# Patient Record
Sex: Male | Born: 1985 | Race: Black or African American | Hispanic: No | Marital: Single | Smoking: Never smoker
Health system: Southern US, Community
[De-identification: ages and names within clinical notes are randomized; demographics above are authoritative.]

## PROBLEM LIST (undated history)

## (undated) DIAGNOSIS — I10 Essential (primary) hypertension: Secondary | ICD-10-CM

---

## 2020-01-20 ENCOUNTER — Inpatient Hospital Stay (HOSPITAL_COMMUNITY): Payer: Medicaid Other

## 2020-01-20 ENCOUNTER — Other Ambulatory Visit: Payer: Self-pay

## 2020-01-20 ENCOUNTER — Inpatient Hospital Stay (HOSPITAL_COMMUNITY)
Admission: EM | Admit: 2020-01-20 | Discharge: 2020-01-24 | DRG: 280 | Disposition: A | Discharge status: Home / Self Care | Payer: Medicaid Other | Attending: Internal Medicine | Admitting: Internal Medicine

## 2020-01-20 ENCOUNTER — Emergency Department (HOSPITAL_COMMUNITY): Payer: Medicaid Other

## 2020-01-20 ENCOUNTER — Encounter (HOSPITAL_COMMUNITY): Payer: Self-pay | Admitting: Emergency Medicine

## 2020-01-20 DIAGNOSIS — Z91120 Patient's intentional underdosing of medication regimen due to financial hardship: Secondary | ICD-10-CM | POA: Diagnosis not present

## 2020-01-20 DIAGNOSIS — I160 Hypertensive urgency: Secondary | ICD-10-CM | POA: Diagnosis not present

## 2020-01-20 DIAGNOSIS — I10 Essential (primary) hypertension: Secondary | ICD-10-CM

## 2020-01-20 DIAGNOSIS — I272 Pulmonary hypertension, unspecified: Secondary | ICD-10-CM | POA: Diagnosis present

## 2020-01-20 DIAGNOSIS — D649 Anemia, unspecified: Secondary | ICD-10-CM | POA: Diagnosis present

## 2020-01-20 DIAGNOSIS — T465X6A Underdosing of other antihypertensive drugs, initial encounter: Secondary | ICD-10-CM | POA: Diagnosis present

## 2020-01-20 DIAGNOSIS — I5043 Acute on chronic combined systolic (congestive) and diastolic (congestive) heart failure: Secondary | ICD-10-CM | POA: Diagnosis present

## 2020-01-20 DIAGNOSIS — Z23 Encounter for immunization: Secondary | ICD-10-CM | POA: Diagnosis not present

## 2020-01-20 DIAGNOSIS — E876 Hypokalemia: Secondary | ICD-10-CM | POA: Diagnosis present

## 2020-01-20 DIAGNOSIS — N179 Acute kidney failure, unspecified: Secondary | ICD-10-CM | POA: Diagnosis present

## 2020-01-20 DIAGNOSIS — Z20822 Contact with and (suspected) exposure to covid-19: Secondary | ICD-10-CM | POA: Diagnosis present

## 2020-01-20 DIAGNOSIS — E785 Hyperlipidemia, unspecified: Secondary | ICD-10-CM | POA: Diagnosis present

## 2020-01-20 DIAGNOSIS — Z9114 Patient's other noncompliance with medication regimen: Secondary | ICD-10-CM

## 2020-01-20 DIAGNOSIS — F1290 Cannabis use, unspecified, uncomplicated: Secondary | ICD-10-CM | POA: Diagnosis present

## 2020-01-20 DIAGNOSIS — I5020 Unspecified systolic (congestive) heart failure: Secondary | ICD-10-CM | POA: Diagnosis not present

## 2020-01-20 DIAGNOSIS — R079 Chest pain, unspecified: Secondary | ICD-10-CM | POA: Diagnosis present

## 2020-01-20 DIAGNOSIS — I161 Hypertensive emergency: Secondary | ICD-10-CM | POA: Diagnosis present

## 2020-01-20 DIAGNOSIS — I428 Other cardiomyopathies: Secondary | ICD-10-CM | POA: Diagnosis present

## 2020-01-20 DIAGNOSIS — I214 Non-ST elevation (NSTEMI) myocardial infarction: Principal | ICD-10-CM | POA: Diagnosis present

## 2020-01-20 DIAGNOSIS — Z8249 Family history of ischemic heart disease and other diseases of the circulatory system: Secondary | ICD-10-CM | POA: Diagnosis not present

## 2020-01-20 DIAGNOSIS — I110 Hypertensive heart disease with heart failure: Secondary | ICD-10-CM | POA: Diagnosis present

## 2020-01-20 DIAGNOSIS — R778 Other specified abnormalities of plasma proteins: Secondary | ICD-10-CM | POA: Diagnosis not present

## 2020-01-20 DIAGNOSIS — Z7289 Other problems related to lifestyle: Secondary | ICD-10-CM

## 2020-01-20 DIAGNOSIS — K769 Liver disease, unspecified: Secondary | ICD-10-CM | POA: Diagnosis present

## 2020-01-20 DIAGNOSIS — I2510 Atherosclerotic heart disease of native coronary artery without angina pectoris: Secondary | ICD-10-CM | POA: Diagnosis present

## 2020-01-20 HISTORY — DX: Essential (primary) hypertension: I10

## 2020-01-20 LAB — CBC WITH DIFFERENTIAL/PLATELET
Abs Immature Granulocytes: 0.01 K/uL (ref 0.00–0.07)
Basophils Absolute: 0 K/uL (ref 0.0–0.1)
Basophils Relative: 1 %
Eosinophils Absolute: 0.1 K/uL (ref 0.0–0.5)
Eosinophils Relative: 1 %
HCT: 38.2 % — ABNORMAL LOW (ref 39.0–52.0)
Hemoglobin: 12.8 g/dL — ABNORMAL LOW (ref 13.0–17.0)
Immature Granulocytes: 0 %
Lymphocytes Relative: 24 %
Lymphs Abs: 1.6 K/uL (ref 0.7–4.0)
MCH: 29.4 pg (ref 26.0–34.0)
MCHC: 33.5 g/dL (ref 30.0–36.0)
MCV: 87.6 fL (ref 80.0–100.0)
Monocytes Absolute: 0.3 K/uL (ref 0.1–1.0)
Monocytes Relative: 5 %
Neutro Abs: 4.6 K/uL (ref 1.7–7.7)
Neutrophils Relative %: 69 %
Platelets: 211 K/uL (ref 150–400)
RBC: 4.36 MIL/uL (ref 4.22–5.81)
RDW: 12.6 % (ref 11.5–15.5)
WBC: 6.6 K/uL (ref 4.0–10.5)
nRBC: 0 % (ref 0.0–0.2)

## 2020-01-20 LAB — HEPARIN LEVEL (UNFRACTIONATED): Heparin Unfractionated: 0.38 IU/mL (ref 0.30–0.70)

## 2020-01-20 LAB — COMPREHENSIVE METABOLIC PANEL WITH GFR
ALT: 21 U/L (ref 0–44)
AST: 24 U/L (ref 15–41)
Albumin: 4.2 g/dL (ref 3.5–5.0)
Alkaline Phosphatase: 52 U/L (ref 38–126)
Anion gap: 10 (ref 5–15)
BUN: 21 mg/dL — ABNORMAL HIGH (ref 6–20)
CO2: 23 mmol/L (ref 22–32)
Calcium: 9 mg/dL (ref 8.9–10.3)
Chloride: 103 mmol/L (ref 98–111)
Creatinine, Ser: 1.44 mg/dL — ABNORMAL HIGH (ref 0.61–1.24)
GFR calc Af Amer: >60 mL/min
GFR calc non Af Amer: >60 mL/min
Glucose, Bld: 128 mg/dL — ABNORMAL HIGH (ref 70–99)
Potassium: 3.8 mmol/L (ref 3.5–5.1)
Sodium: 136 mmol/L (ref 135–145)
Total Bilirubin: 0.8 mg/dL (ref 0.3–1.2)
Total Protein: 7.4 g/dL (ref 6.5–8.1)

## 2020-01-20 LAB — HIV ANTIBODY (ROUTINE TESTING W REFLEX): HIV Screen 4th Generation wRfx: NONREACTIVE

## 2020-01-20 LAB — TSH: TSH: 0.571 u[IU]/mL (ref 0.350–4.500)

## 2020-01-20 LAB — PROTIME-INR
INR: 0.9 (ref 0.8–1.2)
Prothrombin Time: 12.1 seconds (ref 11.4–15.2)

## 2020-01-20 LAB — MRSA PCR SCREENING: MRSA by PCR: NEGATIVE

## 2020-01-20 LAB — TROPONIN I (HIGH SENSITIVITY)
Troponin I (High Sensitivity): 145 ng/L
Troponin I (High Sensitivity): 260 ng/L (ref <18)
Troponin I (High Sensitivity): 347 ng/L (ref <18)
Troponin I (High Sensitivity): 535 ng/L (ref <18)

## 2020-01-20 LAB — ECHOCARDIOGRAM COMPLETE
Height: 72 in
Weight: 3184 oz

## 2020-01-20 LAB — APTT: aPTT: 20 seconds — ABNORMAL LOW (ref 24–36)

## 2020-01-20 LAB — SARS CORONAVIRUS 2 (TAT 6-24 HRS): SARS Coronavirus 2: NEGATIVE

## 2020-01-20 LAB — BRAIN NATRIURETIC PEPTIDE: B Natriuretic Peptide: 216.7 pg/mL — ABNORMAL HIGH (ref 0.0–100.0)

## 2020-01-20 LAB — T4, FREE: Free T4: 1.07 ng/dL (ref 0.61–1.12)

## 2020-01-20 MED ORDER — FUROSEMIDE 10 MG/ML IJ SOLN
20.0000 mg | Freq: Once | INTRAMUSCULAR | Status: AC
  Administered 2020-01-20: 20 mg via INTRAVENOUS
  Filled 2020-01-20: qty 4

## 2020-01-20 MED ORDER — HEPARIN (PORCINE) 25000 UT/250ML-% IV SOLN
1150.0000 [IU]/h | INTRAVENOUS | Status: DC
  Administered 2020-01-20 – 2020-01-21 (×2): 1050 [IU]/h via INTRAVENOUS
  Administered 2020-01-23: 1150 [IU]/h via INTRAVENOUS
  Filled 2020-01-20 (×4): qty 250

## 2020-01-20 MED ORDER — ASPIRIN 81 MG PO CHEW
324.0000 mg | CHEWABLE_TABLET | Freq: Once | ORAL | Status: AC
  Administered 2020-01-20: 324 mg via ORAL
  Filled 2020-01-20: qty 4

## 2020-01-20 MED ORDER — ENOXAPARIN SODIUM 40 MG/0.4ML ~~LOC~~ SOLN: 40.0000 mg | SUBCUTANEOUS | Status: DC

## 2020-01-20 MED ORDER — ONDANSETRON HCL 4 MG/2ML IJ SOLN
4.0000 mg | Freq: Four times a day (QID) | INTRAMUSCULAR | Status: DC | PRN
  Administered 2020-01-20: 4 mg via INTRAVENOUS
  Filled 2020-01-20: qty 2

## 2020-01-20 MED ORDER — ATORVASTATIN CALCIUM 40 MG PO TABS
40.0000 mg | ORAL_TABLET | Freq: Every day | ORAL | Status: DC
  Filled 2020-01-20: qty 1

## 2020-01-20 MED ORDER — LABETALOL HCL 5 MG/ML IV SOLN
0.5000 mg/min | INTRAVENOUS | Status: DC
  Administered 2020-01-20: 0.5 mg/min via INTRAVENOUS
  Administered 2020-01-20 (×2): 2 mg/min via INTRAVENOUS
  Administered 2020-01-21: 0.5 mg/min via INTRAVENOUS
  Filled 2020-01-20 (×6): qty 100
  Filled 2020-01-20: qty 80

## 2020-01-20 MED ORDER — ASPIRIN EC 81 MG PO TBEC
81.0000 mg | DELAYED_RELEASE_TABLET | Freq: Every day | ORAL | Status: DC
  Administered 2020-01-21 – 2020-01-24 (×4): 81 mg via ORAL
  Filled 2020-01-20 (×4): qty 1

## 2020-01-20 MED ORDER — CHLORHEXIDINE GLUCONATE CLOTH 2 % EX PADS
6.0000 | MEDICATED_PAD | Freq: Every day | CUTANEOUS | Status: DC
  Administered 2020-01-21 – 2020-01-22 (×2): 6 via TOPICAL

## 2020-01-20 MED ORDER — FAMOTIDINE 20 MG PO TABS
20.0000 mg | ORAL_TABLET | Freq: Every day | ORAL | Status: DC
  Administered 2020-01-20: 20 mg via ORAL
  Filled 2020-01-20: qty 1

## 2020-01-20 MED ORDER — NITROGLYCERIN 0.4 MG SL SUBL
0.4000 mg | SUBLINGUAL_TABLET | SUBLINGUAL | Status: AC | PRN
  Administered 2020-01-20 (×3): 0.4 mg via SUBLINGUAL
  Filled 2020-01-20: qty 1

## 2020-01-20 MED ORDER — ONDANSETRON HCL 4 MG PO TABS: 4.0000 mg | ORAL_TABLET | Freq: Four times a day (QID) | ORAL | Status: DC | PRN

## 2020-01-20 MED ORDER — HEPARIN BOLUS VIA INFUSION
4000.0000 [IU] | Freq: Once | INTRAVENOUS | Status: AC
  Administered 2020-01-20: 4000 [IU] via INTRAVENOUS
  Filled 2020-01-20: qty 4000

## 2020-01-20 NOTE — H&P (Signed)
NAME:  Jose Sharp, MRN:  [id number], DOB:  18-Sep-1986, LOS: 0 ADMISSION DATE:  01/20/2020, CONSULTATION DATE:  01/20/2020  REFERRING MD:  DR Agbata of [facility], CHIEF COMPLAINT:  Hypertensive emergency   Brief History   x  History of present illness   History is again taken to the patient and Dr. Joylene Igo of tried hospitalist service  34 year old male with a history significant for hypertension.  Last night he ate a subsandwich at 9:30 PM and went to bed.  He woke up early hours of this morning January 20, 2020 due to chest pain and indigestion.  There was associated shortness of breath.  Symptoms got worse with lying down.  There is no headache or dizziness or lightheadedness abdominal pain or urinary symptoms or stroke symptoms.  In the emergency department his systolic blood pressure was 251/179.  Troponin was slightly elevated.  Labetalol infusion was started and the systolic blood pressure was ranging anywhere from 140-160.  His creatinine was 1.44 mg percent.  [No priors] patient was evaluated by cardiology and they noticed that with labetalol his symptoms had improved.  Initial cardiac evaluation showed left arm and right arm systolic blood pressures to be equal but at the time of CCM evaluation on the right arm it was 145 systolic with a MAP of 125 but is in the left arm it was 165 systolic with a slightly more elevated map.  Patient is at [facility] long emergency department but needs to be admitted to cardiac ICU at [facility].  CCM MD admitting and taking over from [facility] hospitalist  Regarding risk factors patient drinks alcohol 4 days/week in varying forms.  Uses marijuana but denies other illicit drugs.  There is concern for sleep apnea because he snores according to cardiology notes.  But has not been tested for sleep apnea. Nom compliant with bp meds  ECHo today - ef 25% with gr3 diast dsyfn  Results for MARSTON, MCCADDEN (MRN [id number]) as of 01/20/2020 17:07  Ref. Range  01/20/2020 07:54 01/20/2020 10:11 01/20/2020 11:29 01/20/2020 12:58  Troponin I (High Sensitivity) Latest Ref Range: <18 ng/L 145 (HH) 260 (HH) 347 (HH) 535 (HH)    Past Medical History     has a past medical history of Hypertension.   reports that he has never smoked. He has never used smokeless tobacco.  History reviewed. No pertinent surgical history.  No Known Allergies   There is no immunization history on file for this patient.  Family History  Problem Relation Age of Onset  . Hypertension Mother      Current Facility-Administered Medications:  .  [START ON 01/21/2020] aspirin EC tablet 81 mg, 81 mg, Oral, Daily, Agbata, Tochukwu, MD .  atorvastatin (LIPITOR) tablet 40 mg, 40 mg, Oral, q1800, Agbata, Tochukwu, MD .  heparin ADULT infusion 100 units/mL (25000 units/267mL sodium chloride 0.45%), 1,050 Units/hr, Intravenous, Continuous, Maurice March, RPH, Last Rate: 10.5 mL/hr at 01/20/20 1206, 1,050 Units/hr at 01/20/20 1206 .  labetalol (NORMODYNE) 500 mg in dextrose 5 % 125 mL (4 mg/mL) infusion, 0.5-3 mg/min, Intravenous, Titrated, Pricilla Loveless, MD, Last Rate: 37.5 mL/hr at 01/20/20 1415, 2.5 mg/min at 01/20/20 1415 .  ondansetron (ZOFRAN) tablet 4 mg, 4 mg, Oral, Q6H PRN **OR** ondansetron (ZOFRAN) injection 4 mg, 4 mg, Intravenous, Q6H PRN, Agbata, Tochukwu, MD No current outpatient medications on file.   Significant Hospital Events    01/20/2020  - admit  Consults:  01/20/2020 - cards  Procedures:  01/20/2020 - ct  chest ordered  Significant Diagnostic Tests:  x  Micro Data:  x  Antimicrobials:  x   Interim history/subjective:  01/20/2020 0 seen by CCM [place] ER bed 05  Objective   Blood pressure (!) 146/103, pulse 68, temperature 98.1 F (36.7 C), temperature source Oral, resp. rate (!) 22, height 6' (1.829 m), weight 90.3 kg, SpO2 95 %.       No intake or output data in the 24 hours ending 01/20/20 1659 Filed Weights   01/20/20 0744  Weight:  90.3 kg    Examination: General: Pleasant male lying down in the stretcher in the emergency department HENT: No elevated JVP.  No neck nodes. Lungs: Clear to auscultation bilaterally.  No crackles Cardiovascular: Regular rate and rhythm.  Hypertensive. Abdomen: Soft nontender no organomegaly Extremities: No cyanosis no clubbing no edema.  Pulses equal on both sides Neuro: Alert and oriented x3.  No focal deficits GU: Not examined  Resolved Hospital Problem list   X  Assessment & Plan:  Hypertensive emergency likely non compliance with elevated troponin Chronic systolic cHF with diast dysfn  Plan  - labetalol infusion - 25% reduction - goal 170-180 - heparin gtt per cards  - check UDS  - CT chest without contrast rule out dissection  - cHF mgmt per cards - will give lasix x 1 x 20mg  though  Mild anemia - unknown baseline  Plan  - check Iron, B12 - - PRBC for hgb </= 6.9gm%    - exceptions are   -  if ACS susepcted/confirmed then transfuse for hgb </= 8.0gm%,  or    -  active bleeding with hemodynamic instability, then transfuse regardless of hemoglobin value   At at all times try to transfuse 1 unit prbc as possible with exception of active hemorrhage   AKI v CKD - creat 1.44- unclear baseline  Plan - monitor with bp control  And lasix x 1    Best practice:  Diet: npo but for meds Pain/Anxiety/Delirium protocol (if indicated): x VAP protocol (if indicated): x DVT prophylaxis: heparin gtt GI prophylaxis: pepcid Glucose control: x Mobility: bed rest Code Status: full Family Communication: patient Disposition: From [facility] to [facility]     ATTESTATION & SIGNATURE    Dr. , M.D., F.C.C.P Pulmonary and Critical Care Medicine Staff Physician [facility] Pager: [phone], If no answer or between  15:00h - 7:00h: call [phone]  01/20/2020 4:59 PM     LABS    PULMONARY No results for  input(s): PHART, PCO2ART, PO2ART, HCO3, TCO2, O2SAT in the last 168 hours.  Invalid input(s): PCO2, PO2  CBC Recent Labs  Lab 01/20/20 0754  HGB 12.8*  HCT 38.2*  WBC 6.6  PLT 211    COAGULATION Recent Labs  Lab 01/20/20 1129  INR 0.9    CARDIAC  No results for input(s): TROPONINI in the last 168 hours. No results for input(s): PROBNP in the last 168 hours.   CHEMISTRY Recent Labs  Lab 01/20/20 0754  NA 136  K 3.8  CL 103  CO2 23  GLUCOSE 128*  BUN 21*  CREATININE 1.44*  CALCIUM 9.0   Estimated Creatinine Clearance: 78.6 mL/min (A) (by C-G formula based on SCr of 1.44 mg/dL (H)).   LIVER Recent Labs  Lab 01/20/20 0754 01/20/20 1129  AST 24  --   ALT 21  --   ALKPHOS 52  --   BILITOT  0.8  --   PROT 7.4  --   ALBUMIN 4.2  --   INR  --  0.9     INFECTIOUS No results for input(s): LATICACIDVEN, PROCALCITON in the last 168 hours.   ENDOCRINE CBG (last 3)  No results for input(s): GLUCAP in the last 72 hours.       IMAGING x48h  - image(s) personally visualized  -   highlighted in bold DG Chest 2 View  Result Date: 01/20/2020 CLINICAL DATA:  Chest pain and hypertension EXAM: CHEST - 2 VIEW COMPARISON:  None. FINDINGS: Borderline enlargement of the left ventricle. Interstitial opacity with [employer]. No effusion or pneumothorax. IMPRESSION: Pulmonary edema. Electronically Signed   By: Monte Fantasia M.D.   On: 01/20/2020 08:56   ECHOCARDIOGRAM COMPLETE  Result Date: 01/20/2020    ECHOCARDIOGRAM REPORT   Patient Name:   MARSELINO Ishman Date of Exam: 01/20/2020 Medical Rec #:  [id number]         Height:       72.0 in Accession #:    [id number]        Weight:       199.0 lb Date of Birth:  [birth year]        BSA:          2.126 m Patient Age:    41 years          BP:           162/121 mmHg Patient Gender: M                 HR:           73 bpm. Exam Location:  Inpatient Procedure: 2D Echo, Cardiac Doppler and Color Doppler Indications:    Chest  pain  History:        Patient has no prior history of Echocardiogram examinations.                 Arrythmias:Abnormal EKG; Signs/Symptoms:Chest Pain.  Sonographer:    Dustin Flock Referring Phys: [id number] TOCHUKWU AGBATA IMPRESSIONS  1. Left ventricular ejection fraction, by estimation, is 25 to 30%. The left ventricle has severely decreased function. The left ventricle demonstrates global hypokinesis. There is moderate concentric left ventricular hypertrophy. Left ventricular diastolic parameters are consistent with Grade III diastolic dysfunction (restrictive). Elevated left atrial pressure.  2. Right ventricular systolic function is low normal. The right ventricular size is normal. There is mildly elevated pulmonary artery systolic pressure. The estimated right ventricular systolic pressure is 03.5 mmHg.  3. Left atrial size was mildly dilated.  4. The mitral valve is grossly normal. Trivial mitral valve regurgitation. No evidence of mitral stenosis.  5. The aortic valve is tricuspid. Aortic valve regurgitation is not visualized. No aortic stenosis is present.  6. The inferior vena cava is normal in size with <50% respiratory variability, suggesting right atrial pressure of 8 mmHg. FINDINGS  Left Ventricle: Left ventricular ejection fraction, by estimation, is 25 to 30%. The left ventricle has severely decreased function. The left ventricle demonstrates global hypokinesis. The left ventricular internal cavity size was normal in size. There is moderate concentric left ventricular hypertrophy. Left ventricular diastolic parameters are consistent with Grade III diastolic dysfunction (restrictive). Elevated left atrial pressure. Right Ventricle: The right ventricular size is normal. No increase in right ventricular wall thickness. Right ventricular systolic function is low normal. There is mildly elevated pulmonary artery systolic pressure. The tricuspid regurgitant velocity is 2.73 m/s, and with an assumed right  atrial pressure of 8 mmHg, the estimated right ventricular systolic pressure is 37.8 mmHg. Left Atrium: Left atrial size was mildly dilated. Right Atrium: Right atrial size was normal in size. Pericardium: There is no evidence of pericardial effusion. Mitral Valve: The mitral valve is grossly normal. Trivial mitral valve regurgitation. No evidence of mitral valve stenosis. Tricuspid Valve: The tricuspid valve is grossly normal. Tricuspid valve regurgitation is not demonstrated. No evidence of tricuspid stenosis. Aortic Valve: The aortic valve is tricuspid. Aortic valve regurgitation is not visualized. No aortic stenosis is present. Pulmonic Valve: The pulmonic valve was grossly normal. Pulmonic valve regurgitation is not visualized. No evidence of pulmonic stenosis. Aorta: The aortic root is normal in size and structure. Venous: The inferior vena cava is normal in size with less than 50% respiratory variability, suggesting right atrial pressure of 8 mmHg. IAS/Shunts: No atrial level shunt detected by color flow Doppler.  LEFT VENTRICLE PLAX 2D LVIDd:         5.40 cm  Diastology LVIDs:         4.50 cm  LV e' lateral:   6.00 cm/s LV PW:         1.50 cm  LV E/e' lateral: 13.6 LV IVS:        1.50 cm  LV e' medial:    5.98 cm/s LVOT diam:     2.60 cm  LV E/e' medial:  13.6 LV SV:         73 LV SV Index:   34 LVOT Area:     5.31 cm  RIGHT VENTRICLE RV Basal diam:  2.60 cm RV S prime:     9.57 cm/s TAPSE (M-mode): 2.5 cm LEFT ATRIUM             Index       RIGHT ATRIUM           Index LA diam:        4.60 cm 2.16 cm/m  RA Area:     14.20 cm LA Vol (A2C):   67.0 ml 31.51 ml/m RA Volume:   35.00 ml  16.46 ml/m LA Vol (A4C):   68.6 ml 32.27 ml/m LA Biplane Vol: 68.7 ml 32.31 ml/m  AORTIC VALVE LVOT Vmax:   70.50 cm/s LVOT Vmean:  51.300 cm/s LVOT VTI:    0.137 m  AORTA Ao Root diam: 3.20 cm MITRAL VALVE               TRICUSPID VALVE MV Area (PHT): 4.49 cm    TR Peak grad:   29.8 mmHg MV Decel Time: 169 msec    TR  Vmax:        273.00 cm/s MV E velocity: 81.40 cm/s MV A velocity: 38.10 cm/s  SHUNTS MV E/A ratio:  2.14        Systemic VTI:  0.14 m                            Systemic Diam: 2.60 cm Lennie Odor MD Electronically signed by Lennie Odor MD Signature Date/Time: 01/20/2020/3:13:38 PM    Final

## 2020-01-20 NOTE — ED Notes (Signed)
[facility] called for transport. 

## 2020-01-20 NOTE — ED Notes (Signed)
Pt aware urine sample is needed 

## 2020-01-20 NOTE — ED Triage Notes (Signed)
Patient arrived by self from phone. Patient c/o Chest pain on sternal area that started 1000 PM last night.   History of HTN.

## 2020-01-20 NOTE — ED Notes (Signed)
[facility] bedside.  

## 2020-01-20 NOTE — Progress Notes (Signed)
ANTICOAGULATION CONSULT NOTE   Pharmacy Consult for heparin Indication: chest pain/ACS  No Known Allergies  Patient Measurements: Height: 6' (182.9 cm) Weight: 199 lb (90.3 kg) IBW/kg (Calculated) : 77.6 Heparin Dosing Weight: 90.3kg  Vital Signs: Temp: 98.3 F (36.8 C) (03/26 2000) Temp Source: Oral (03/26 2000) BP: 148/108 (03/26 1945) Pulse Rate: 71 (03/26 1945)  Labs: Recent Labs    01/20/20 0754 01/20/20 0754 01/20/20 1011 01/20/20 1129 01/20/20 1258 01/20/20 2012  HGB 12.8*  --   --   --   --   --   HCT 38.2*  --   --   --   --   --   PLT 211  --   --   --   --   --   APTT  --   --   --  20*  --   --   LABPROT  --   --   --  12.1  --   --   INR  --   --   --  0.9  --   --   HEPARINUNFRC  --   --   --   --   --  0.38  CREATININE 1.44*  --   --   --   --   --   TROPONINIHS 145*   < > 260* 347* 535*  --    < > = values in this interval not displayed.    Estimated Creatinine Clearance: 78.6 mL/min (A) (by C-G formula based on SCr of 1.44 mg/dL (H)).   Medical History: Past Medical History:  Diagnosis Date  . Hypertension      Assessment: 34 y.o. male with medical history significant for hypertension who presents to the ER for evaluation of chest pain.  Pharmacy consulted to dose heparin.  No prior AC noted. Echo ordered -heparin level at goal  Goal of Therapy:  Heparin level 0.3-0.7 units/ml Monitor platelets by anticoagulation protocol   Plan:  -Continue heparin at 1050 units/hr -Daily heparin level and CBC  Harland German, PharmD Clinical Pharmacist **Pharmacist phone directory can now be found on [email or web address] ([email or web address]).  Listed under [facility].

## 2020-01-20 NOTE — ED Provider Notes (Signed)
[facility] DEPT Provider Note   CSN: [id number] Arrival date & time: 01/20/20  5400     History Chief Complaint  Patient presents with  . Chest Pain    Jose Sharp is a 34 y.o. male.  HPI 34 year old male presents with chest pain.  Started last night shortly after eating half of a [employer] sub.  Feels like a pressure in the middle and just right of middle in his chest.  No radiation of the pain.  No shortness of breath, back pain, abdominal pain or weakness.  No leg swelling.  Felt a little better after drinking water but then when he laid down last night the pain seemed to get worse.  Has been on and off all night.  Right now is moderate, 5 out of 10.  He has a history of hypertension but is not on treatment at this time.  Walking around/exertion did not make the pain worse.   Past Medical History:  Diagnosis Date  . Hypertension     There are no problems to display for this patient.   History reviewed. No pertinent surgical history.     No family history on file.  Social History   Tobacco Use  . Smoking status: Not on file  Substance Use Topics  . Alcohol use: Not on file  . Drug use: Not on file    Home Medications Prior to Admission medications   Not on File    Allergies    Patient has no known allergies.  Review of Systems   Review of Systems  Constitutional: Negative for diaphoresis.  Respiratory: Negative for shortness of breath.   Cardiovascular: Positive for chest pain. Negative for leg swelling.  Gastrointestinal: Negative for abdominal pain, nausea and vomiting.  Musculoskeletal: Negative for back pain.  All other systems reviewed and are negative.   Physical Exam Updated Vital Signs BP (!) 211/148   Pulse 79   Temp 98.1 F (36.7 C) (Oral)   Resp (!) 21   Ht 6' (1.829 m)   Wt 90.3 kg   SpO2 99%   BMI 26.99 kg/m   Physical Exam Vitals and nursing note reviewed.  Constitutional:      General: He  is not in acute distress.    Appearance: He is well-developed. He is not ill-appearing or diaphoretic.  HENT:     Head: Normocephalic and atraumatic.     Right Ear: External ear normal.     Left Ear: External ear normal.     Nose: Nose normal.  Eyes:     General:        Right eye: No discharge.        Left eye: No discharge.  Cardiovascular:     Rate and Rhythm: Normal rate and regular rhythm.     Pulses:          Radial pulses are 2+ on the right side and 2+ on the left side.     Heart sounds: Normal heart sounds.  Pulmonary:     Effort: Pulmonary effort is normal.     Breath sounds: Normal breath sounds.  Abdominal:     Palpations: Abdomen is soft.     Tenderness: There is no abdominal tenderness.  Musculoskeletal:     Cervical back: Neck supple.     Right lower leg: No edema.     Left lower leg: No edema.  Skin:    General: Skin is warm and dry.  Neurological:  Mental Status: He is alert.  Psychiatric:        Mood and Affect: Mood is not anxious.     ED Results / Procedures / Treatments   Labs (all labs ordered are listed, but only abnormal results are displayed) Labs Reviewed  CBC WITH DIFFERENTIAL/PLATELET - Abnormal; Notable for the following components:      Result Value   Hemoglobin 12.8 (*)    HCT 38.2 (*)    All other components within normal limits  COMPREHENSIVE METABOLIC PANEL - Abnormal; Notable for the following components:   Glucose, Bld 128 (*)    BUN 21 (*)    Creatinine, Ser 1.44 (*)    All other components within normal limits  BRAIN NATRIURETIC PEPTIDE - Abnormal; Notable for the following components:   B Natriuretic Peptide 216.7 (*)    All other components within normal limits  TROPONIN I (HIGH SENSITIVITY) - Abnormal; Notable for the following components:   Troponin I (High Sensitivity) 145 (*)    All other components within normal limits  SARS CORONAVIRUS 2 (TAT 6-24 HRS)  TROPONIN I (HIGH SENSITIVITY)    EKG EKG  Interpretation  Date/Time:  Friday January 20 2020 07:45:55 EDT Ventricular Rate:  104 PR Interval:    QRS Duration: 101 QT Interval:  334 QTC Calculation: 440 R Axis:   68 Text Interpretation: Sinus tachycardia Probable left atrial enlargement LVH with secondary repolarization abnormality ST depr, consider ischemia, inferior leads Baseline wander in lead(s) II III aVF No old tracing to compare Confirmed by Pricilla Loveless [phone]) on 01/20/2020 7:54:32 AM   Radiology DG Chest 2 View  Result Date: 01/20/2020 CLINICAL DATA:  Chest pain and hypertension EXAM: CHEST - 2 VIEW COMPARISON:  None. FINDINGS: Borderline enlargement of the left ventricle. Interstitial opacity with [employer]. No effusion or pneumothorax. IMPRESSION: Pulmonary edema. Electronically Signed   By: Marnee Spring M.D.   On: 01/20/2020 08:56    Procedures .Critical Care Performed by: Pricilla Loveless, MD Authorized by: Pricilla Loveless, MD   Critical care provider statement:    Critical care time (minutes):  35   Critical care time was exclusive of:  Separately billable procedures and treating other patients   Critical care was necessary to treat or prevent imminent or life-threatening deterioration of the following conditions:  Cardiac failure   Critical care was time spent personally by me on the following activities:  Discussions with consultants, evaluation of patient's response to treatment, examination of patient, ordering and performing treatments and interventions, ordering and review of laboratory studies, ordering and review of radiographic studies, pulse oximetry, re-evaluation of patient's condition, obtaining history from patient or surrogate and review of old charts   (including critical care time)  Medications Ordered in ED Medications  labetalol (NORMODYNE) 500 mg in dextrose 5 % 125 mL (4 mg/mL) infusion (has no administration in time range)  aspirin chewable tablet 324 mg (324 mg Oral Given 01/20/20  0801)  nitroGLYCERIN (NITROSTAT) SL tablet 0.4 mg (0.4 mg Sublingual Given 01/20/20 6213)    ED Course  I have reviewed the triage vital signs and the nursing notes.  Pertinent labs & imaging results that were available during my care of the patient were reviewed by me and considered in my medical decision making (see chart for details).  Clinical Course as of Jan 20 1023  Fri Jan 20, 2020  1006 Discussed with Dr. Cristal Deer of cardiology.  Recommends blood pressure control as if this is hypertensive emergency.  Otherwise,  this appears to be related to the hypertension primarily and unlikely to be ACS.  Recommends hospitalist admission, echo, blood pressure control.  No indication he needs to come to [facility].   [SG]  1016 Discussed with Dr. Joylene Igo for admission. Pain is essentially gone (occasionally a 1). She will see and consider what other anti-hypertensives, otherwise hold off on more meds for now. Requests cards consult, Rosann Auerbach updated on need for consultation.   [SG]  1022 Dr Joylene Igo asks for labetalol infusion given continued high blood pressure   [SG]    Clinical Course User Index [SG] Pricilla Loveless, MD   MDM Rules/Calculators/A&P                      Patient's chest pain is atypical.  However with his significantly elevated blood pressure, work-up obtained and shows elevated troponin.  ECG is abnormal but probably LVH.  No STEMI.  He is not having back pain, tearing type pain, or any other concerning features for aortic dissection.  However I do think that the blood pressure is directly causing the troponin elevation and so this would technically be hypertensive emergency.  Given meds as above. Given aspirin. No heparin at this time.  Ibrohim Simmers was evaluated in Emergency Department on 01/20/2020 for the symptoms described in the history of present illness. He was evaluated in the context of the global COVID-19 pandemic, which necessitated consideration that the patient  might be at risk for infection with the SARS-CoV-2 virus that causes COVID-19. Institutional protocols and algorithms that pertain to the evaluation of patients at risk for COVID-19 are in a state of rapid change based on information released by regulatory bodies including the CDC and federal and state organizations. These policies and algorithms were followed during the patient's care in the ED.  Final Clinical Impression(s) / ED Diagnoses Final diagnoses:  Hypertensive emergency    Rx / DC Orders ED Discharge Orders    None       Pricilla Loveless, MD 01/20/20 1138

## 2020-01-20 NOTE — Progress Notes (Signed)
ANTICOAGULATION CONSULT NOTE - Initial Consult  Pharmacy Consult for heparin Indication: chest pain/ACS  No Known Allergies  Patient Measurements: Height: 6' (182.9 cm) Weight: 199 lb (90.3 kg) IBW/kg (Calculated) : 77.6 Heparin Dosing Weight: 90.3kg  Vital Signs: Temp: 98.1 F (36.7 C) (03/26 0746) Temp Source: Oral (03/26 0746) BP: 201/153 (03/26 1045) Pulse Rate: 80 (03/26 1045)  Labs: Recent Labs    01/20/20 0754 01/20/20 1011  HGB 12.8*  --   HCT 38.2*  --   PLT 211  --   CREATININE 1.44*  --   TROPONINIHS 145* 260*    Estimated Creatinine Clearance: 78.6 mL/min (A) (by C-G formula based on SCr of 1.44 mg/dL (H)).   Medical History: Past Medical History:  Diagnosis Date  . Hypertension      Assessment: 34 y.o. male with medical history significant for hypertension who presents to the ER for evaluation of chest pain. Pt with hx of hypertension but has been noncompliant with medication.  Pharmacy consulted to dose heparin.  No prior AC noted.  01/20/2020  CBC ok Scr 1.44 APTT and INR ordered STAT  Goal of Therapy:  Heparin level 0.3-0.7 units/ml Monitor platelets by anticoagulation protocol   Plan:  Heparin bolus 4000 units then start heparin drip at 1050 units/hr Heparin level in 6 hours Daily CBC and heparin level  Arley Phenix RPh 01/20/2020, 11:14 AM

## 2020-01-20 NOTE — Progress Notes (Signed)
  Echocardiogram 2D Echocardiogram has been performed.  Pieter Partridge 01/20/2020, 12:58 PM

## 2020-01-20 NOTE — Consult Note (Addendum)
Cardiology Consultation:   Patient ID: Jose Sharp MRN: [id number]; DOB: [birth year]  Admit date: 01/20/2020 Date of Consult: 01/20/2020  Primary Care Provider: System, Pcp Not In Primary Cardiologist: No primary care provider on file. New to Dr. Clifton James Primary Electrophysiologist:  None   Patient Profile:   Jose Sharp is a 34 y.o. male with a hx of HTN who is being seen today for the evaluation of chest pain at the request of Dr. Joylene Igo.  History of Present Illness:   Jose Sharp was diagnosed with HTN around 2015 when he had presented for evaluation for eyelid issues. He would have been around 27 at the time. He did not usually follow with medical care otherwise so it's unclear when this officially began. He recalls having even higher BP than he was seen to have here (>250's). He was started on 3 medications plus a potassium pill. He denies secondary workup at that time. He took them for a year or so but discontinued when he could not financially afford them. He tried to focus on diet/activity and lost some weight from 215lb down to 199 now. He is now working for [employer] and does a fair amount of walking with his job. He has not had any recent cardiac limitation with this. He denies any other medical issues. He does have a family history of HTN. He has been visiting [facility] for a while here with intention to possibly move here, but was supposed to leave this week.   Last night he ate a [employer] sub and rested in bed watching TV. He began to notice central chest discomfort that felt like indigestion. It was mild at the time so he was able to go back to sleep. He woke up around 2am with continued symptoms. He was able to get up and walk around a little with mild improvement but it did not fully resolve. Tums did not help. Over the next few hours the discomfort continued to wax and wane. It did not radiate anywhere, including to his back. It did not feel like a ripping or tearing  sensation. He did not feel any SOB, nausea, vomiting, diaphoresis or palpitations. Due to discomfort he came to the ED and was found to have BP of 251/79, normal pulse ox. He was given 3 SL NTG, 324mg  ASA and started on a labetalol drip with improvement in symptoms. Chest pain has resolved. Requested bilateral BPs - left arm 194/142, 193/144. Internal medicine has admitted for hypertensive emergency. Cardiology asked to see for CP, abnormal EKG and elevated troponin. hsTroponin 145->260, Hgb 12.8, Cr 1.44, glucose 128, no prior to comare to.  Regarding risk factors, the patient drinks ETOH 4 days per week in various forms depending on whether week day or weekend. He uses THC but denies other illicit drugs. His significant other does report he snores significantly. He has not been tested for OSA.   Past Medical History:  Diagnosis Date  . Hypertension     History reviewed. No pertinent surgical history.   Home Medications:  Prior to Admission medications   Not on File    Inpatient Medications: Scheduled Meds: . [START ON 01/21/2020] aspirin EC  81 mg Oral Daily  . atorvastatin  40 mg Oral q1800   Continuous Infusions: . heparin 1,050 Units/hr (01/20/20 1206)  . labetalol (NORMODYNE) infusion 2.5 mg/min (01/20/20 1147)   PRN Meds: ondansetron **OR** ondansetron (ZOFRAN) IV  Allergies:   No Known Allergies  Social History:   Social History  Socioeconomic History  . Marital status: Single    Spouse name: Not on file  . Number of children: Not on file  . Years of education: Not on file  . Highest education level: Not on file  Occupational History  . Not on file  Tobacco Use  . Smoking status: Never Smoker  . Smokeless tobacco: Never Used  Substance and Sexual Activity  . Alcohol use: Yes    Comment: 4 days per week, varying amounts  . Drug use: Yes    Types: Marijuana  . Sexual activity: Not on file  Other Topics Concern  . Not on file  Social History Narrative  . Not  on file   Social Determinants of Health   Financial Resource Strain:   . Difficulty of Paying Living Expenses:   Food Insecurity:   . Worried About Charity fundraiser in the Last Year:   . Arboriculturist in the Last Year:   Transportation Needs:   . Film/video editor (Medical):   Marland Kitchen Lack of Transportation (Non-Medical):   Physical Activity:   . Days of Exercise per Week:   . Minutes of Exercise per Session:   Stress:   . Feeling of Stress :   Social Connections:   . Frequency of Communication with Friends and Family:   . Frequency of Social Gatherings with Friends and Family:   . Attends Religious Services:   . Active Member of Clubs or Organizations:   . Attends Archivist Meetings:   Marland Kitchen Marital Status:   Intimate Partner Violence:   . Fear of Current or Ex-Partner:   . Emotionally Abused:   Marland Kitchen Physically Abused:   . Sexually Abused:     Family History:    Family History  Problem Relation Age of Onset  . Hypertension Mother      ROS:  Please see the history of present illness.   All other ROS reviewed and negative.     Physical Exam/Data:   Vitals:   01/20/20 1115 01/20/20 1130 01/20/20 1145 01/20/20 1200  BP: (!) 193/143 (!) 185/129 (!) 189/118 (!) 194/142  Pulse: 74 68 72 76  Resp: 18 12 18 20   Temp:      TempSrc:      SpO2: 95% 98% 98% 99%  Weight:      Height:       No intake or output data in the 24 hours ending 01/20/20 1236 Last 3 Weights 01/20/2020  Weight (lbs) 199 lb  Weight (kg) 90.266 kg     Body mass index is 26.99 kg/m.  Vital Signs. BP (!) 194/142   Pulse 76   Temp 98.1 F (36.7 C) (Oral)   Resp 20   Ht 6' (1.829 m)   Wt 90.3 kg   SpO2 99%   BMI 26.99 kg/m  General: Well developed, well nourished AAM, in no acute distress. Head: Normocephalic, atraumatic, sclera non-icteric, no xanthomas, nares are without discharge. Neck: Negative for carotid bruits. JVP not elevated. Lungs: Clear bilaterally to auscultation  without wheezes, rales, or rhonchi. Breathing is unlabored. Heart: RRR S1 S2 without murmurs, rubs, or gallops.  Abdomen: Soft, non-tender, non-distended with normoactive bowel sounds. No rebound/guarding. Extremities: No clubbing or cyanosis. No edema. Distal pedal pulses are 2+ and equal bilaterally. Pulses equal bilaterally upper extremities. Neuro: Alert and oriented X 3. Moves all extremities spontaneously. Psych:  Responds to questions appropriately with a normal affect.   EKG:  The EKG was personally  reviewed and demonstrates:  sinus tach 104bpm, probable LAE, LVH with secondary repolarization abnormality, inferior ST depression with TWI, and TWI V5-V6  Telemetry:  Telemetry was personally reviewed and demonstrates:  NSR  Relevant CV Studies: None  Laboratory Data:  High Sensitivity Troponin:   Recent Labs  Lab 01/20/20 0754 01/20/20 1011 01/20/20 1129  TROPONINIHS 145* 260* 347*     Chemistry Recent Labs  Lab 01/20/20 0754  NA 136  K 3.8  CL 103  CO2 23  GLUCOSE 128*  BUN 21*  CREATININE 1.44*  CALCIUM 9.0  GFRNONAA >60  GFRAA >60  ANIONGAP 10    Recent Labs  Lab 01/20/20 0754  PROT 7.4  ALBUMIN 4.2  AST 24  ALT 21  ALKPHOS 52  BILITOT 0.8   Hematology Recent Labs  Lab 01/20/20 0754  WBC 6.6  RBC 4.36  HGB 12.8*  HCT 38.2*  MCV 87.6  MCH 29.4  MCHC 33.5  RDW 12.6  PLT 211   BNP Recent Labs  Lab 01/20/20 0754  BNP 216.7*    DDimer No results for input(s): DDIMER in the last 168 hours.   Radiology/Studies:  DG Chest 2 View  Result Date: 01/20/2020 CLINICAL DATA:  Chest pain and hypertension EXAM: CHEST - 2 VIEW COMPARISON:  None. FINDINGS: Borderline enlargement of the left ventricle. Interstitial opacity with [employer]. No effusion or pneumothorax. IMPRESSION: Pulmonary edema. Electronically Signed   By: Marnee Spring M.D.   On: 01/20/2020 08:56       HEAR Score (for undifferentiated chest pain):     I received error message  that "Content was blocked because it was not signed by a valid security certificate. "  Assessment and Plan:   1. Chest pain/elevated troponin - unclear at present juncture whether troponin elevation is related to accelerated hypertension or acute coronary syndrome. Cannot exclude underlying CAD given untreated HTN as risk factor. Internal medicine is presently managing his blood pressure. Dissection was felt less likely by the ED. Mediastinal contours are normal CXR. Blood pressures are equal bilaterally and pulses are equal. He has no murmurs or bruits on exam to suggest vascular disruption. Agree with plan for aspirin, heparin, statin and labetalol drip. Await echocardiogram. Check lipid profile in AM. I will discuss management with Dr. Clifton James. Patient made aware to notify for any recurrent discomfort.  2. Hypertensive emergency - HTN was diagnosed at fairly young age. He attributed this to his weight but was not markedly obese at the time. Recommend workup for causes of secondary HTN. Will check thyroid, UA to assess for proteinuria, 24-hour urine metanephrines/catecholamines, renin/aldo ratio, and UDS for completeness. His diagnosis of severe HTN (even higher than 250s) around age 74 without obvious other causes warrants renal artery duplex to exclude renal artery stenosis. Discussed with Although he does not really meet the typical body habitus for OSA, he does snore so sleep study would be suggested as outpatient. Internal medicine is currently managing blood pressure. Need to watch renal function.  3. Renal insufficiency - no prior baseline, will need to trend. Check UA for proteinuria. Avoid nephrotoxic agents acutely.  4. Habitual alcohol intake  - would benefit from reduction in intake, currently 48oz of beer or few wine 4 day per week, sometimes liquor on weekends. Will defer decision for whether he needs CIWA to admitting team.  For questions or updates, please contact [facility] Please consult [email or web address] for contact info under     Signed, Laurann Montana, PA-C  01/20/2020 12:36 PM   I have personally seen and examined this patient. I agree with the assessment and plan as outlined above.  34 yo male with history of HTN admitted with hypertensive emergency (251/79) and chest pain. His chest pain resolved with control of his BP. Troponin mildly elevated (347). EKG personally reviewed by me and shows sinus with LVH, non-specific ST/T wave abnormalities. No prior EKG for review.  My exam:  General: Well developed, well nourished, NAD  HEENT: OP clear, mucus membranes moist  SKIN: warm, dry. No rashes. Neuro: No focal deficits  Musculoskeletal: Muscle strength 5/5 all ext  Psychiatric: Mood and affect normal  Neck: No JVD, no carotid bruits, no thyromegaly, no lymphadenopathy.  Lungs:Clear bilaterally, no wheezes, rhonci, crackles Cardiovascular: Regular rate and rhythm. No murmurs, gallops or rubs. Abdomen:Soft. Bowel sounds present. Non-tender.  Extremities: No lower extremity edema. Pulses are 2 + in the bilateral DP/PT.  Plan: Chest pain and elevated troponin in the setting of hypertensive emergency: Cannot exclude ACS with mild elevation of troponin but the troponin is likely elevated due to demand ischemia. The primary team is effectively lowering his BP now with IV beta blockade. He will ultimately need an ischemic workup. Will cycle troponin today to peak and arrange an echo. Agree with IV heparin for now. We will see him tomorrow to make further decisions based on his labs and echo results.   Verne Carrow 01/20/2020 1:33 PM

## 2020-01-20 NOTE — H&P (Signed)
History and Physical    Jose Sharp IZT:[id number] DOB: [birth year] DOA: 01/20/2020  PCP: System, Pcp Not In   Patient coming from: Home  I have personally briefly reviewed patient's old medical records in [place]  Chief Complaint: Chest pain  HPI: Jose Sharp is a 34 y.o. male with medical history significant for hypertension who presents to the emergency room for evaluation of chest discomfort mostly in the mid sternal area which woke him up out of his sleep in the early hours of the morning.  Patient states that he had eaten half of a [employer] sub at about 9:30 PM and went straight to bed.  He woke up in the early hours of the morning due to discomfort in his chest which he described as indigestion.  It was associated with shortness of breath but he denies having any nausea, vomiting, diaphoresis or palpitations.  It was non radiating. He tried sitting up and drinking water without any significant improvement in his symptoms.  He states that his symptoms worsened with laying down.  At the time of his arrival to the emergency room he rated his discomfort a 5 x 10 in intensity at its worst and it was completely resolved with nitroglycerin.  He denies having any headache, dizziness or lightheadedness, no abdominal pain or changes in his bowel habits.  No urinary symptoms, no fever or chills. At the time of his admission he is currently chest pain-free Chest x-ray showed pulmonary edema  ED Course: Patient presented to the emergency room for evaluation of mid sternal chest discomfort with significantly elevated blood pressure 251/179.  His troponin is elevated He was started on a labetalol drip for uncontrolled blood pressure. Initial troponin was elevated.  Review of Systems: As per HPI otherwise 10 point review of systems negative.    Past Medical History:  Diagnosis Date  . Hypertension     History reviewed. No pertinent surgical history.   has no history on file for  tobacco, alcohol, and drug.  No Known Allergies  No family history on file.   Prior to Admission medications   Not on File    Physical Exam: Vitals:   01/20/20 0800 01/20/20 0915 01/20/20 0945 01/20/20 1030  BP: (!) 224/165 (!) 196/151 (!) 211/148 (!) 218/155  Pulse: 86  79 83  Resp: 20 14 (!) 21 16  Temp:      TempSrc:      SpO2: 98%  99% 98%  Weight:      Height:         Vitals:   01/20/20 0800 01/20/20 0915 01/20/20 0945 01/20/20 1030  BP: (!) 224/165 (!) 196/151 (!) 211/148 (!) 218/155  Pulse: 86  79 83  Resp: 20 14 (!) 21 16  Temp:      TempSrc:      SpO2: 98%  99% 98%  Weight:      Height:        Constitutional: NAD, alert and oriented x 3 Eyes: PERRL, lids and conjunctivae normal ENMT: Mucous membranes are moist.  Neck: normal, supple, no masses, no thyromegaly Respiratory: Bilateral air entry, no wheezing, no crackles. Normal respiratory effort. No accessory muscle use Cardiovascular: Tachycardia, no murmurs / rubs / gallops. No extremity edema. 2+ pedal pulses. No carotid bruits.  Chest pain is nonreproducible Abdomen: no tenderness, no masses palpated. No hepatosplenomegaly. Bowel sounds positive.  Musculoskeletal: no clubbing / cyanosis. No joint deformity upper and lower extremities.  Skin: no rashes, lesions, ulcers.  Neurologic: No gross focal neurologic deficit. Psychiatric: Normal mood and affect.   Labs on Admission: I have personally reviewed following labs and imaging studies  CBC: Recent Labs  Lab 01/20/20 0754  WBC 6.6  NEUTROABS 4.6  HGB 12.8*  HCT 38.2*  MCV 87.6  PLT 211   Basic Metabolic Panel: Recent Labs  Lab 01/20/20 0754  NA 136  K 3.8  CL 103  CO2 23  GLUCOSE 128*  BUN 21*  CREATININE 1.44*  CALCIUM 9.0   GFR: Estimated Creatinine Clearance: 78.6 mL/min (A) (by C-G formula based on SCr of 1.44 mg/dL (H)). Liver Function Tests: Recent Labs  Lab 01/20/20 0754  AST 24  ALT 21  ALKPHOS 52  BILITOT 0.8    PROT 7.4  ALBUMIN 4.2   No results for input(s): LIPASE, AMYLASE in the last 168 hours. No results for input(s): AMMONIA in the last 168 hours. Coagulation Profile: No results for input(s): INR, PROTIME in the last 168 hours. Cardiac Enzymes: No results for input(s): CKTOTAL, CKMB, CKMBINDEX, TROPONINI in the last 168 hours. BNP (last 3 results) No results for input(s): PROBNP in the last 8760 hours. HbA1C: No results for input(s): HGBA1C in the last 72 hours. CBG: No results for input(s): GLUCAP in the last 168 hours. Lipid Profile: No results for input(s): CHOL, HDL, LDLCALC, TRIG, CHOLHDL, LDLDIRECT in the last 72 hours. Thyroid Function Tests: No results for input(s): TSH, T4TOTAL, FREET4, T3FREE, THYROIDAB in the last 72 hours. Anemia Panel: No results for input(s): VITAMINB12, FOLATE, FERRITIN, TIBC, IRON, RETICCTPCT in the last 72 hours. Urine analysis: No results found for: COLORURINE, APPEARANCEUR, LABSPEC, PHURINE, GLUCOSEU, HGBUR, BILIRUBINUR, KETONESUR, PROTEINUR, UROBILINOGEN, NITRITE, LEUKOCYTESUR  Radiological Exams on Admission: DG Chest 2 View  Result Date: 01/20/2020 CLINICAL DATA:  Chest pain and hypertension EXAM: CHEST - 2 VIEW COMPARISON:  None. FINDINGS: Borderline enlargement of the left ventricle. Interstitial opacity with [employer]. No effusion or pneumothorax. IMPRESSION: Pulmonary edema. Electronically Signed   By: Marnee Spring M.D.   On: 01/20/2020 08:56    EKG: Independently reviewed. Sinus tachycardia LVH ST depression in inferior leads  Assessment/Plan Principal Problem:   Chest pain at rest Active Problems:   Hypertensive emergency   Elevated troponin    Chest pain at rest Etiology unclear and may be related to hypertensive emergency to rule out NSTEMI Pain was relieved by nitroglycerin Obtain serial cardiac enzymes Patient on aspirin, statins and beta-blockers Will request cardiology consult   Hypertensive emergency Patient  has a history of hypertension but has been noncompliant medication Blood pressure was significantly elevated upon arrival to the emergency room Place patient on a labetalol drip Obtain 2D echocardiogram to assess LVEF Transition to oral antihypertensive medications once blood pressure improves to <   Elevated troponin Patient has an elevated troponin level of 145 Troponin elevation may be secondary to LV strain from hypertensive emergency to rule out an ischemic event Obtain serial cardiac enzymes Consult cardiology  DVT prophylaxis: Lovenox Code Status: Full Family Communication: Plan of care was discussed with patient at the bedside. He verbalizes understanding and agrees with the plan Disposition Plan: Back to previous home environment Consults called: Cardiology    Lawan Nanez MD [facility]     01/20/2020, 10:34 AM

## 2020-01-20 NOTE — Progress Notes (Signed)
eLink Physician-Brief Progress Note Patient Name: Jose Sharp DOB: [birth year] MRN: [id number]   Date of Service  01/20/2020  HPI/Events of Note  Pt needs a diet order.  eICU Interventions  Diet ordered.        Thomasene Lot Ahren Pettinger 01/20/2020, 9:29 PM

## 2020-01-21 ENCOUNTER — Encounter (HOSPITAL_COMMUNITY): Payer: Medicaid Other

## 2020-01-21 LAB — RAPID URINE DRUG SCREEN, HOSP PERFORMED
Amphetamines: NOT DETECTED
Barbiturates: NOT DETECTED
Benzodiazepines: NOT DETECTED
Cocaine: NOT DETECTED
Opiates: NOT DETECTED
Tetrahydrocannabinol: POSITIVE — AB

## 2020-01-21 LAB — CBC
HCT: 34.3 % — ABNORMAL LOW (ref 39.0–52.0)
Hemoglobin: 11.7 g/dL — ABNORMAL LOW (ref 13.0–17.0)
MCH: 29.2 pg (ref 26.0–34.0)
MCHC: 34.1 g/dL (ref 30.0–36.0)
MCV: 85.5 fL (ref 80.0–100.0)
Platelets: 212 10*3/uL (ref 150–400)
RBC: 4.01 MIL/uL — ABNORMAL LOW (ref 4.22–5.81)
RDW: 12.8 % (ref 11.5–15.5)
WBC: 7.5 10*3/uL (ref 4.0–10.5)
nRBC: 0 % (ref 0.0–0.2)

## 2020-01-21 LAB — HEPARIN LEVEL (UNFRACTIONATED): Heparin Unfractionated: 0.39 IU/mL (ref 0.30–0.70)

## 2020-01-21 LAB — BASIC METABOLIC PANEL
Anion gap: 11 (ref 5–15)
BUN: 17 mg/dL (ref 6–20)
CO2: 24 mmol/L (ref 22–32)
Calcium: 9 mg/dL (ref 8.9–10.3)
Chloride: 100 mmol/L (ref 98–111)
Creatinine, Ser: 1.89 mg/dL — ABNORMAL HIGH (ref 0.61–1.24)
GFR calc Af Amer: 52 mL/min — ABNORMAL LOW (ref >60)
GFR calc non Af Amer: 45 mL/min — ABNORMAL LOW (ref >60)
Glucose, Bld: 110 mg/dL — ABNORMAL HIGH (ref 70–99)
Potassium: 2.9 mmol/L — ABNORMAL LOW (ref 3.5–5.1)
Sodium: 135 mmol/L (ref 135–145)

## 2020-01-21 LAB — LACTIC ACID, PLASMA
Lactic Acid, Venous: 1.4 mmol/L (ref 0.5–1.9)
Lactic Acid, Venous: 1.2 mmol/L (ref 0.5–1.9)

## 2020-01-21 LAB — HEPATIC FUNCTION PANEL
ALT: 21 U/L (ref 0–44)
AST: 55 U/L — ABNORMAL HIGH (ref 15–41)
Albumin: 3.6 g/dL (ref 3.5–5.0)
Alkaline Phosphatase: 40 U/L (ref 38–126)
Bilirubin, Direct: 0.1 mg/dL (ref 0.0–0.2)
Indirect Bilirubin: 0.9 mg/dL (ref 0.3–0.9)
Total Bilirubin: 1 mg/dL (ref 0.3–1.2)
Total Protein: 6.5 g/dL (ref 6.5–8.1)

## 2020-01-21 LAB — LIPID PANEL
Cholesterol: 251 mg/dL — ABNORMAL HIGH (ref 0–200)
HDL: 50 mg/dL (ref >40)
LDL Cholesterol: 181 mg/dL — ABNORMAL HIGH (ref 0–99)
Total CHOL/HDL Ratio: 5 RATIO
Triglycerides: 99 mg/dL (ref <150)
VLDL: 20 mg/dL (ref 0–40)

## 2020-01-21 LAB — TROPONIN I (HIGH SENSITIVITY): Troponin I (High Sensitivity): 6378 ng/L (ref <18)

## 2020-01-21 LAB — MAGNESIUM: Magnesium: 1.7 mg/dL (ref 1.7–2.4)

## 2020-01-21 LAB — PHOSPHORUS: Phosphorus: 4.1 mg/dL (ref 2.5–4.6)

## 2020-01-21 MED ORDER — POTASSIUM CHLORIDE CRYS ER 20 MEQ PO TBCR
40.0000 meq | EXTENDED_RELEASE_TABLET | Freq: Once | ORAL | Status: AC
  Administered 2020-01-21: 40 meq via ORAL
  Filled 2020-01-21: qty 2

## 2020-01-21 MED ORDER — POTASSIUM CHLORIDE CRYS ER 10 MEQ PO TBCR
30.0000 meq | EXTENDED_RELEASE_TABLET | ORAL | Status: AC
  Administered 2020-01-21: 30 meq via ORAL
  Filled 2020-01-21: qty 3

## 2020-01-21 MED ORDER — POTASSIUM CHLORIDE CRYS ER 20 MEQ PO TBCR: 40.0000 meq | EXTENDED_RELEASE_TABLET | ORAL | Status: DC

## 2020-01-21 MED ORDER — CARVEDILOL 25 MG PO TABS
25.0000 mg | ORAL_TABLET | Freq: Two times a day (BID) | ORAL | Status: DC
  Administered 2020-01-21 – 2020-01-24 (×7): 25 mg via ORAL
  Filled 2020-01-21 (×7): qty 1

## 2020-01-21 MED ORDER — MAGNESIUM SULFATE IN D5W 1-5 GM/100ML-% IV SOLN
1.0000 g | Freq: Once | INTRAVENOUS | Status: AC
  Administered 2020-01-21: 1 g via INTRAVENOUS
  Filled 2020-01-21: qty 100

## 2020-01-21 MED ORDER — ISOSORB DINITRATE-HYDRALAZINE 20-37.5 MG PO TABS
2.0000 | ORAL_TABLET | Freq: Three times a day (TID) | ORAL | Status: DC
  Administered 2020-01-21 – 2020-01-23 (×6): 2 via ORAL
  Filled 2020-01-21 (×8): qty 2

## 2020-01-21 MED ORDER — ATORVASTATIN CALCIUM 80 MG PO TABS
80.0000 mg | ORAL_TABLET | Freq: Every day | ORAL | Status: DC
  Administered 2020-01-21 – 2020-01-23 (×3): 80 mg via ORAL
  Filled 2020-01-21 (×3): qty 1

## 2020-01-21 NOTE — Progress Notes (Signed)
Right forearm PIV appeared to be in the beginning stage of infiltration, patient reported pain and slight swelling present RN removed at 1100.

## 2020-01-21 NOTE — Progress Notes (Signed)
NAME:  Jose Sharp, MRN:  [id number], DOB:  [birth year], LOS: 1 ADMISSION DATE:  01/20/2020, CONSULTATION DATE:  01/20/2020  REFERRING MD:  DR Agbata of [facility], CHIEF COMPLAINT:  Hypertensive emergency   Brief History   34 yo M presents to ED with chest and eye pain, noted to be experiencing hypertensive emergency. Started on labetalol gtt 3/26 and transferred to [facility] for cardiology consult.   History of present illness   History is again taken to the patient and Dr. Francine Graven of tried hospitalist service  34 year old male with a history significant for hypertension.  Last night he ate a subsandwich at 9:30 PM and went to bed.  He woke up early hours of this morning January 20, 2020 due to chest pain and indigestion.  There was associated shortness of breath.  Symptoms got worse with lying down.  There is no headache or dizziness or lightheadedness abdominal pain or urinary symptoms or stroke symptoms.  In the emergency department his systolic blood pressure was 251/179.  Troponin was slightly elevated.  Labetalol infusion was started and the systolic blood pressure was ranging anywhere from 140-160.  His creatinine was 1.44 mg percent.  [No priors] patient was evaluated by cardiology and they noticed that with labetalol his symptoms had improved.  Initial cardiac evaluation showed left arm and right arm systolic blood pressures to be equal but at the time of CCM evaluation on the right arm it was 202 systolic with a MAP of 542 but is in the left arm it was 706 systolic with a slightly more elevated map.  Patient is at [facility] long emergency department but needs to be admitted to cardiac ICU at [place] MD admitting and taking over from [facility] hospitalist  Regarding risk factors patient drinks alcohol 4 days/week in varying forms.  Uses marijuana but denies other illicit drugs.  There is concern for sleep apnea because he snores according to cardiology notes.  But has not been tested for  sleep apnea. Nom compliant with bp meds  ECHo today - ef 25% with gr3 diast dsyfn  Results for Jose Sharp (MRN [id number]) as of 01/20/2020 17:07  Ref. Range 01/20/2020 07:54 01/20/2020 10:11 01/20/2020 11:29 01/20/2020 12:58  Troponin I (High Sensitivity) Latest Ref Range: <18 ng/L 145 (HH) 260 (HH) 347 (HH) 535 (HH)    Past Medical History     has a past medical history of Hypertension.   reports that he has never smoked. He has never used smokeless tobacco.  History reviewed. No pertinent surgical history.  No Known Allergies   There is no immunization history on file for this patient.  Family History  Problem Relation Age of Onset  . Hypertension Mother      Current Facility-Administered Medications:  .  aspirin EC tablet 81 mg, 81 mg, Oral, Daily, Agbata, Tochukwu, MD, 81 mg at 01/21/20 0839 .  atorvastatin (LIPITOR) tablet 80 mg, 80 mg, Oral, q1800, O'Neal, Cassie Freer, MD .  carvedilol (COREG) tablet 25 mg, 25 mg, Oral, BID WC, O'Neal, Cassie Freer, MD .  Chlorhexidine Gluconate Cloth 2 % PADS 6 each, 6 each, Topical, Daily, Ramaswamy, Murali, MD .  heparin ADULT infusion 100 units/mL (25000 units/234mL sodium chloride 0.45%), 1,050 Units/hr, Intravenous, Continuous, Angela Adam, RPH, Last Rate: 10.5 mL/hr at 01/21/20 0700, 1,050 Units/hr at 01/21/20 0700 .  isosorbide-hydrALAZINE (BIDIL) 20-37.5 MG per tablet 2 tablet, 2 tablet, Oral, TID, O'Neal, Cassie Freer, MD .  ondansetron [facility]) tablet 4 mg,  4 mg, Oral, Q6H PRN **OR** ondansetron (ZOFRAN) injection 4 mg, 4 mg, Intravenous, Q6H PRN, Agbata, Tochukwu, MD, 4 mg at 01/20/20 2059   Significant Hospital Events    01/20/2020  - admit 3/27> off labetalol gtt with SBP < 130   Consults:  01/20/2020 - cards  Procedures:    Significant Diagnostic Tests:  3/26 CT chest non-con> no dissection. 50mm focus of low attenuation within anterolateral aspect of liver dome. Possible hemangioma.  3/26 ECHO> LVEF  25-30%, grade III diastolic dysfunction. RVSP 37.8  Micro Data:  3/26 SARS Cov2> neg   Antimicrobials:    Interim history/subjective:  Off labetalol gtt this morning No complaints of chest pain   Objective   Blood pressure 133/85, pulse 66, temperature 98.7 F (37.1 C), temperature source Oral, resp. rate (!) 25, height 6' (1.829 m), weight 90.3 kg, SpO2 95 %.        Intake/Output Summary (Last 24 hours) at 01/21/2020 0916 Last data filed at 01/21/2020 0700 Gross per 24 hour  Intake 880.45 ml  Output --  Net 880.45 ml   Filed Weights   01/20/20 0744  Weight: 90.3 kg    Examination: General: Pleasant WDWN adult M, seated in recliner, NAD  HENT: NCAT. Pink mmm. Trachea midline. Anicteric sclera  Lungs: CTA bilaterally. Symmetrical chest expansion. Even, unlabored respirations  Cardiovascular: RRR s1s2 no rgm. Cap refill < 3 seconds BUE BLE  Abdomen: Soft round ndnt. Normoactive x4 Extremities: No obvious joint deformity. Symmetrical muscle bulk and tone. No cyanosis or clubbing  Neuro: AAO x 4 following commands. PERRLA 52mm  GU: Defer   Resolved Hospital Problem list     Assessment & Plan:   Hypertensive emergency  Acute Systolic heart failure with diastolic dysfunction -LVEF 25-30%, grade III diastolic dysfunction. RVSP 37.8 Plan - off labetalol gtt - Coreg, Bidil - awaiting renal function to improve prior to ACE/ARB initiation -Renal artery [place]   Elevated troponin -demand ischemia vs NSTEMI -likely secondary to HTN emergency above  -ECG with profound LVH Plan -cards likely to pursue cath this admission when renal function improves  -continue ASA, lipitor, heparin gtt   HLD Plan -Lipitor 80mg  qD  Mild anemia  Plan  -Trend CBC -goal >8; no indication for transfusion at this time   AKI Plan - continue to trend renal indices  - renal artery as above   Hypokalemia  Borderline Hypomagnesemia  Plan -replace PRN, trend BMP, mag phos   Low  attenuation lesion within liver -3mm focus of parenchymal low attenuation within anterolateral aspect of liver dome -possible hemangioma  Plan -recommend non-urgent hepatic ultrasound. Can be ordered this admission    Best practice:  Diet: PO Pain/Anxiety/Delirium protocol (if indicated): na VAP protocol (if indicated): na DVT prophylaxis: heparin gtt GI prophylaxis: pepcid Glucose control: monitor Mobility: PT Code Status: full Family Communication: Patient updated 3/27 Disposition: Patient is off labetalol gtt. I suspect he is stable for transfer to SDU; will discuss with PCCM MD    Critical Care Time: n/a   4/27 MSN, AGACNP-BC [facility] Tessie Fass If no answer, [phone] 01/21/2020, 9:17 AM

## 2020-01-21 NOTE — Progress Notes (Signed)
eLink Physician-Brief Progress Note Patient Name: Jose Sharp DOB: [birth year] MRN: [id number]   Date of Service  01/21/2020  HPI/Events of Note  K+ 2.9, GFR 52  eICU Interventions  KCL 30 meq po Q 4 hours x 2 doses.        Thomasene Lot Cleda Imel 01/21/2020, 5:15 AM

## 2020-01-21 NOTE — Progress Notes (Signed)
PT Cancellation/Discharge Note  Patient Details Name: Jose Sharp MRN: [id number] DOB: 1986/09/28   Cancelled Treatment:    Reason Eval/Treat Not Completed: PT screened, no needs identified, will sign off. RN reports pt is independent.  No PT needs at this time.    Thanks,  Corinna Capra, PT, DPT  Acute Rehabilitation [phone] pager #[phone] office       [name] 01/21/2020, 11:18 AM

## 2020-01-21 NOTE — Progress Notes (Signed)
Cardiology Progress Note  Patient ID: Jose Sharp MRN: [id number] DOB: 01-24-1986 Date of Encounter: 01/21/2020  Primary Cardiologist: No primary care provider on file.  Subjective  Blood pressure under good control.  EF down to 25-30% with global hypokinesis.  Denies any chest pain.  Reports his pain was actually indigestion.  Troponins gone up to 6,378.  Denies chest pain today.  ROS:  All other ROS reviewed and negative. Pertinent positives noted in the HPI.     Inpatient Medications  Scheduled Meds: . aspirin EC  81 mg Oral Daily  . atorvastatin  40 mg Oral q1800  . carvedilol  25 mg Oral BID WC  . Chlorhexidine Gluconate Cloth  6 each Topical Daily  . isosorbide-hydrALAZINE  2 tablet Oral TID   Continuous Infusions: . heparin 1,050 Units/hr (01/21/20 0700)   PRN Meds: ondansetron **OR** ondansetron (ZOFRAN) IV   Vital Signs   Vitals:   01/21/20 0645 01/21/20 0700 01/21/20 0715 01/21/20 0730  BP: 136/84 (!) 149/91 138/85 133/85  Pulse: 61 (!) 59 72 66  Resp: 18 17 14  (!) 25  Temp:      TempSrc:      SpO2: 91% 93% 97% 95%  Weight:      Height:        Intake/Output Summary (Last 24 hours) at 01/21/2020 0903 Last data filed at 01/21/2020 0700 Gross per 24 hour  Intake 880.45 ml  Output --  Net 880.45 ml   Last 3 Weights 01/20/2020  Weight (lbs) 199 lb  Weight (kg) 90.266 kg      Telemetry  Overnight telemetry shows normal sinus rhythm, which I personally reviewed.   ECG  The most recent ECG shows normal sinus rhythm heart rate 66, diffuse anterolateral T wave inversions, which I personally reviewed.   Physical Exam   Vitals:   01/21/20 0645 01/21/20 0700 01/21/20 0715 01/21/20 0730  BP: 136/84 (!) 149/91 138/85 133/85  Pulse: 61 (!) 59 72 66  Resp: 18 17 14  (!) 25  Temp:      TempSrc:      SpO2: 91% 93% 97% 95%  Weight:      Height:         Intake/Output Summary (Last 24 hours) at 01/21/2020 0903 Last data filed at 01/21/2020 0700 Gross  per 24 hour  Intake 880.45 ml  Output --  Net 880.45 ml    Last 3 Weights 01/20/2020  Weight (lbs) 199 lb  Weight (kg) 90.266 kg    Body mass index is 26.99 kg/m.  General: Well nourished, well developed, in no acute distress Head: Atraumatic, normal size  Eyes: PEERLA, EOMI  Neck: Supple, no JVD Endocrine: No thryomegaly Cardiac: Normal S1, S2; RRR; no murmurs, rubs, or gallops Lungs: Clear to auscultation bilaterally, no wheezing, rhonchi or rales  Abd: Soft, nontender, no hepatomegaly  Ext: No edema, pulses 2+ Musculoskeletal: No deformities, BUE and BLE strength normal and equal Skin: Warm and dry, no rashes   Neuro: Alert and oriented to person, place, time, and situation, CNII-XII grossly intact, no focal deficits  Psych: Normal mood and affect   Labs  High Sensitivity Troponin:   Recent Labs  Lab 01/20/20 0754 01/20/20 1011 01/20/20 1129 01/20/20 1258 01/21/20 0548  TROPONINIHS 145* 260* 347* 535* 6,378*     Cardiac EnzymesNo results for input(s): TROPONINI in the last 168 hours. No results for input(s): TROPIPOC in the last 168 hours.  Chemistry Recent Labs  Lab 01/20/20 0754 01/21/20 0308  NA  136 135  K 3.8 2.9*  CL 103 100  CO2 23 24  GLUCOSE 128* 110*  BUN 21* 17  CREATININE 1.44* 1.89*  CALCIUM 9.0 9.0  PROT 7.4 6.5  ALBUMIN 4.2 3.6  AST 24 55*  ALT 21 21  ALKPHOS 52 40  BILITOT 0.8 1.0  GFRNONAA >60 45*  GFRAA >60 52*  ANIONGAP 10 11    Hematology Recent Labs  Lab 01/20/20 0754 01/21/20 0308  WBC 6.6 7.5  RBC 4.36 4.01*  HGB 12.8* 11.7*  HCT 38.2* 34.3*  MCV 87.6 85.5  MCH 29.4 29.2  MCHC 33.5 34.1  RDW 12.6 12.8  PLT 211 212   BNP Recent Labs  Lab 01/20/20 0754  BNP 216.7*    DDimer No results for input(s): DDIMER in the last 168 hours.   Radiology  DG Chest 2 View  Result Date: 01/20/2020 CLINICAL DATA:  Chest pain and hypertension EXAM: CHEST - 2 VIEW COMPARISON:  None. FINDINGS: Borderline enlargement of the left  ventricle. Interstitial opacity with [employer]. No effusion or pneumothorax. IMPRESSION: Pulmonary edema. Electronically Signed   By: Marnee Spring M.D.   On: 01/20/2020 08:56   CT CHEST WO CONTRAST  Result Date: 01/20/2020 CLINICAL DATA:  Chest pain. EXAM: CT CHEST WITHOUT CONTRAST TECHNIQUE: Multidetector CT imaging of the chest was performed following the standard protocol without IV contrast. COMPARISON:  None. FINDINGS: Cardiovascular: No significant vascular findings. Normal heart size. No pericardial effusion. Mediastinum/Nodes: No enlarged mediastinal or axillary lymph nodes. Thyroid gland, trachea, and esophagus demonstrate no significant findings. Lungs/Pleura: A trace amount of atelectasis is seen within the posterior aspect of the bilateral lung bases. There is no evidence of acute infiltrate, pleural effusion or pneumothorax. Upper Abdomen: A 6 mm focus of parenchymal low attenuation is seen within the anterolateral aspect of the liver dome. Musculoskeletal: No chest wall mass or suspicious bone lesions identified. IMPRESSION: 1. No acute findings in the chest. 2. 6 mm focus of parenchymal low attenuation within the anterolateral aspect of the liver dome. This may represent a small hemangioma. Correlation with nonemergent hepatic ultrasound is recommended. Electronically Signed   By: Aram Candela M.D.   On: 01/20/2020 18:21   ECHOCARDIOGRAM COMPLETE  Result Date: 01/20/2020    ECHOCARDIOGRAM REPORT   Patient Name:   Jose Sharp Date of Exam: 01/20/2020 Medical Rec #:  [id number]         Height:       72.0 in Accession #:    [id number]        Weight:       199.0 lb Date of Birth:  1986/03/10        BSA:          2.126 m Patient Age:    34 years          BP:           162/121 mmHg Patient Gender: M                 HR:           73 bpm. Exam Location:  Inpatient Procedure: 2D Echo, Cardiac Doppler and Color Doppler Indications:    Chest pain  History:        Patient has no prior  history of Echocardiogram examinations.                 Arrythmias:Abnormal EKG; Signs/Symptoms:Chest Pain.  Sonographer:    Lavenia Atlas Referring Phys: [id number] OVZCHYIF AGBATA  IMPRESSIONS  1. Left ventricular ejection fraction, by estimation, is 25 to 30%. The left ventricle has severely decreased function. The left ventricle demonstrates global hypokinesis. There is moderate concentric left ventricular hypertrophy. Left ventricular diastolic parameters are consistent with Grade III diastolic dysfunction (restrictive). Elevated left atrial pressure.  2. Right ventricular systolic function is low normal. The right ventricular size is normal. There is mildly elevated pulmonary artery systolic pressure. The estimated right ventricular systolic pressure is 09.9 mmHg.  3. Left atrial size was mildly dilated.  4. The mitral valve is grossly normal. Trivial mitral valve regurgitation. No evidence of mitral stenosis.  5. The aortic valve is tricuspid. Aortic valve regurgitation is not visualized. No aortic stenosis is present.  6. The inferior vena cava is normal in size with <50% respiratory variability, suggesting right atrial pressure of 8 mmHg. FINDINGS  Left Ventricle: Left ventricular ejection fraction, by estimation, is 25 to 30%. The left ventricle has severely decreased function. The left ventricle demonstrates global hypokinesis. The left ventricular internal cavity size was normal in size. There is moderate concentric left ventricular hypertrophy. Left ventricular diastolic parameters are consistent with Grade III diastolic dysfunction (restrictive). Elevated left atrial pressure. Right Ventricle: The right ventricular size is normal. No increase in right ventricular wall thickness. Right ventricular systolic function is low normal. There is mildly elevated pulmonary artery systolic pressure. The tricuspid regurgitant velocity is 2.73 m/s, and with an assumed right atrial pressure of 8 mmHg, the estimated  right ventricular systolic pressure is 83.3 mmHg. Left Atrium: Left atrial size was mildly dilated. Right Atrium: Right atrial size was normal in size. Pericardium: There is no evidence of pericardial effusion. Mitral Valve: The mitral valve is grossly normal. Trivial mitral valve regurgitation. No evidence of mitral valve stenosis. Tricuspid Valve: The tricuspid valve is grossly normal. Tricuspid valve regurgitation is not demonstrated. No evidence of tricuspid stenosis. Aortic Valve: The aortic valve is tricuspid. Aortic valve regurgitation is not visualized. No aortic stenosis is present. Pulmonic Valve: The pulmonic valve was grossly normal. Pulmonic valve regurgitation is not visualized. No evidence of pulmonic stenosis. Aorta: The aortic root is normal in size and structure. Venous: The inferior vena cava is normal in size with less than 50% respiratory variability, suggesting right atrial pressure of 8 mmHg. IAS/Shunts: No atrial level shunt detected by color flow Doppler.  LEFT VENTRICLE PLAX 2D LVIDd:         5.40 cm  Diastology LVIDs:         4.50 cm  LV e' lateral:   6.00 cm/s LV PW:         1.50 cm  LV E/e' lateral: 13.6 LV IVS:        1.50 cm  LV e' medial:    5.98 cm/s LVOT diam:     2.60 cm  LV E/e' medial:  13.6 LV SV:         73 LV SV Index:   34 LVOT Area:     5.31 cm  RIGHT VENTRICLE RV Basal diam:  2.60 cm RV S prime:     9.57 cm/s TAPSE (M-mode): 2.5 cm LEFT ATRIUM             Index       RIGHT ATRIUM           Index LA diam:        4.60 cm 2.16 cm/m  RA Area:     14.20 cm LA Vol (A2C):   67.0  ml 31.51 ml/m RA Volume:   35.00 ml  16.46 ml/m LA Vol (A4C):   68.6 ml 32.27 ml/m LA Biplane Vol: 68.7 ml 32.31 ml/m  AORTIC VALVE LVOT Vmax:   70.50 cm/s LVOT Vmean:  51.300 cm/s LVOT VTI:    0.137 m  AORTA Ao Root diam: 3.20 cm MITRAL VALVE               TRICUSPID VALVE MV Area (PHT): 4.49 cm    TR Peak grad:   29.8 mmHg MV Decel Time: 169 msec    TR Vmax:        273.00 cm/s MV E velocity: 81.40  cm/s MV A velocity: 38.10 cm/s  SHUNTS MV E/A ratio:  2.14        Systemic VTI:  0.14 m                            Systemic Diam: 2.60 cm Lennie Odor MD Electronically signed by Lennie Odor MD Signature Date/Time: 01/20/2020/3:13:38 PM    Final     Cardiac Studies  TTE 01/20/2020 1. Left ventricular ejection fraction, by estimation, is 25 to 30%. The  left ventricle has severely decreased function. The left ventricle  demonstrates global hypokinesis. There is moderate concentric left  ventricular hypertrophy. Left ventricular  diastolic parameters are consistent with Grade III diastolic dysfunction  (restrictive). Elevated left atrial pressure.  2. Right ventricular systolic function is low normal. The right  ventricular size is normal. There is mildly elevated pulmonary artery  systolic pressure. The estimated right ventricular systolic pressure is  37.8 mmHg.  3. Left atrial size was mildly dilated.  4. The mitral valve is grossly normal. Trivial mitral valve  regurgitation. No evidence of mitral stenosis.  5. The aortic valve is tricuspid. Aortic valve regurgitation is not  visualized. No aortic stenosis is present.  6. The inferior vena cava is normal in size with <50% respiratory  variability, suggesting right atrial pressure of 8 mmHg.   Patient Profile  Jose Sharp is a 33 y.o. male with history of hypertension who was admitted with hypertensive emergency with chest pain on 01/20/2020.  Echocardiogram shows low EF.  Troponins up to 6300.  Assessment & Plan  1.  Hypertensive emergency -Initially presented with eye pain and systolic blood pressures in the 250s.  He did have some indigestion in his chest. -Was on labetalol drip -Ejection fraction 25-30% -Troponin has increased up to 6300 today.  EKG demonstrates LVH with diffuse anterolateral T wave inversions.  There are markedly pronounced.  He denies chest pain today. -I have suspect this is just hypertensive  crisis with secondary demand ischemia.  The troponin rise is concerning.  He will remain on heparin as well as aspirin for now.  He is quite comfortable without any chest pain on my examination.  We will likely plan for heart catheterization once his kidney function improves.  There is no rush to do this given his lack of symptoms.  If his troponin has not elevated so highly likely could just avoid cardiac cath.  I see no way to avoid this now. -Blood pressure severely elevated.  I ordered a UDS.  Thyroid studies are normal, TSH 0.57.  I have reordered plasma metanephrines as well as Arin and Aldo level.  He apparently has had high blood pressure for years and is likely wind up being essential hypertension that is untreated.  We will do our due  diligence to work this up. -He is low risk for renal artery stenosis.  Did team yesterday ordered this.  We will go ahead and proceed with that today. -outpatient sleep study -I will go ahead and start him on Coreg 25 mg twice daily.  We will also start BiDil 20-37.5 2 tabs twice daily. -Pending kidney improvement we will likely start ACE/ARB versus Arni.  2. Elevated troponin, non-STEMI versus demand ischemia -Troponins have increased up to 6378.  EKG with pronounced anterior lateral T wave changes.  There is no ST elevation.  His EKG likely just represents profound LVH.  He has no apical variant hypertrophic cardiomyopathy on his echocardiogram.  His EF is 25-30% with global hypokinesis and no regional wall motion normalities. -I would suspect this is all secondary to his hypertensive crisis.  However, given the rising troponin we will likely pursue heart catheterization this admission.  Kidney function is prohibiting that.  Furthermore, he is without symptoms today and has no chest pain.  I think we can pursue heart cath once kidney function is better. -He will remain on aspirin, Lipitor 40 mg daily, heparin drip for now -No symptoms of chest pain reported.   Left heart cath likely Monday or Tuesday pending kidney function  3.  New onset systolic heart failure, ejection fraction 25-30% -Likely secondary to hypertension.  Discussion above regarding elevated troponin and need for left heart catheterization prior to discharge. -Normal thyroid studies.  UDS pending.  We have also sent work-up for secondary hypertension.  Will need outpatient sleep study. -Warm and dry on examination.  No evidence of volume overload. -I will start Coreg 25 mg twice daily.  We will start BiDil 20-37.5 milligrams 2 tabs 3 times daily.  No ACE/ARB/Arni given kidney function.  We will start this as we are able. -He is euvolemic on examination.  I have stopped his Lasix. -We will work with you to optimize his medical regimen  4. HLD -Most recent LDL 181.  I have increased his Lipitor to 80 mg daily.  For questions or updates, please contact [facility] Please consult [email or web address] for contact info under   Time Spent with Patient: I have spent a total of 35 minutes with patient reviewing hospital notes, telemetry, EKGs, labs and examining the patient as well as establishing an assessment and plan that was discussed with the patient.  > 50% of time was spent in direct patient care.    Signed, Lenna Gilford. Flora Lipps, MD [facility]  [facility]  01/21/2020 9:03 AM

## 2020-01-21 NOTE — Progress Notes (Signed)
CRITICAL VALUE ALERT  Critical Value:  Troponin 6,378  Date & Time Notied:  01/21/2020 0703  Provider Notified: On call APP cardiology   Orders Received/Actions taken: no new orders

## 2020-01-21 NOTE — Progress Notes (Signed)
ANTICOAGULATION CONSULT NOTE   Pharmacy Consult for heparin Indication: chest pain/ACS  No Known Allergies  Patient Measurements: Height: 6' (182.9 cm) Weight: 199 lb (90.3 kg) IBW/kg (Calculated) : 77.6 Heparin Dosing Weight: 90.3kg  Vital Signs: Temp: 98.4 F (36.9 C) (03/27 1210) Temp Source: Oral (03/27 1210) BP: 115/69 (03/27 1300) Pulse Rate: 64 (03/27 1300)  Labs: Recent Labs    01/20/20 0754 01/20/20 1011 01/20/20 1129 01/20/20 1258 01/20/20 2012 01/21/20 0308 01/21/20 0548 01/21/20 1128  HGB 12.8*  --   --   --   --  11.7*  --   --   HCT 38.2*  --   --   --   --  34.3*  --   --   PLT 211  --   --   --   --  212  --   --   APTT  --   --  20*  --   --   --   --   --   LABPROT  --   --  12.1  --   --   --   --   --   INR  --   --  0.9  --   --   --   --   --   HEPARINUNFRC  --   --   --   --  0.38  --   --  0.39  CREATININE 1.44*  --   --   --   --  1.89*  --   --   TROPONINIHS 145*   < > 347* 535*  --   --  6,378*  --    < > = values in this interval not displayed.    Estimated Creatinine Clearance: 59.9 mL/min (A) (by C-G formula based on SCr of 1.89 mg/dL (H)).   Medical History: Past Medical History:  Diagnosis Date  . Hypertension      Assessment: 34 y.o. male with medical history significant for hypertension who presents to the ER for evaluation of chest pain.  Pharmacy consulted to dose heparin.  No prior AC noted.   Heparin level continues to be within therapeutic range this morning at 0.39. Hgb 11.7, pltc 212. No bleeding noted.   Goal of Therapy:  Heparin level 0.3-0.7 units/ml Monitor platelets by anticoagulation protocol   Plan:  -Continue heparin at 1050 units/hr -Daily heparin level and CBC  Sheppard Coil PharmD., BCPS Clinical Pharmacist 01/21/2020 1:45 PM

## 2020-01-22 LAB — MAGNESIUM: Magnesium: 2.5 mg/dL — ABNORMAL HIGH (ref 1.7–2.4)

## 2020-01-22 LAB — CBC
HCT: 33 % — ABNORMAL LOW (ref 39.0–52.0)
Hemoglobin: 11 g/dL — ABNORMAL LOW (ref 13.0–17.0)
MCH: 28.6 pg (ref 26.0–34.0)
MCHC: 33.3 g/dL (ref 30.0–36.0)
MCV: 85.9 fL (ref 80.0–100.0)
Platelets: 201 10*3/uL (ref 150–400)
RBC: 3.84 MIL/uL — ABNORMAL LOW (ref 4.22–5.81)
RDW: 13.2 % (ref 11.5–15.5)
WBC: 7.7 10*3/uL (ref 4.0–10.5)
nRBC: 0 % (ref 0.0–0.2)

## 2020-01-22 LAB — BASIC METABOLIC PANEL
Anion gap: 11 (ref 5–15)
BUN: 17 mg/dL (ref 6–20)
CO2: 23 mmol/L (ref 22–32)
Calcium: 8.9 mg/dL (ref 8.9–10.3)
Chloride: 102 mmol/L (ref 98–111)
Creatinine, Ser: 1.85 mg/dL — ABNORMAL HIGH (ref 0.61–1.24)
GFR calc Af Amer: 53 mL/min — ABNORMAL LOW (ref >60)
GFR calc non Af Amer: 46 mL/min — ABNORMAL LOW (ref >60)
Glucose, Bld: 102 mg/dL — ABNORMAL HIGH (ref 70–99)
Potassium: 3.3 mmol/L — ABNORMAL LOW (ref 3.5–5.1)
Sodium: 136 mmol/L (ref 135–145)

## 2020-01-22 LAB — TROPONIN I (HIGH SENSITIVITY): Troponin I (High Sensitivity): 6397 ng/L (ref <18)

## 2020-01-22 LAB — PHOSPHORUS: Phosphorus: 2.8 mg/dL (ref 2.5–4.6)

## 2020-01-22 LAB — HEPARIN LEVEL (UNFRACTIONATED): Heparin Unfractionated: 0.28 IU/mL — ABNORMAL LOW (ref 0.30–0.70)

## 2020-01-22 NOTE — Progress Notes (Signed)
Patient arrived to room 3E05 from [facility]. Patient is oriented to the room and unit. Heart monitor placed and vitals assessed. Will continue to monitor and review orders

## 2020-01-22 NOTE — Progress Notes (Signed)
PROGRESS NOTE    Jose Sharp  ZOX:[id number] DOB: 11-12-1985 DOA: 01/20/2020 PCP: System, Pcp Not In   Brief Narrative:  34 year old male with a history significant for hypertension, woke up early hours of January 20, 2020 due to chest pain and indigestion.  There was associated shortness of breath.  Symptoms got worse with lying down.  There is no headache or dizziness or lightheadedness abdominal pain or urinary symptoms or stroke symptoms.  In the emergency department his systolic blood pressure was 251/179.  Troponin was slightly elevated.  Labetalol infusion was started and the systolic blood pressure was ranging anywhere from 140-160.  His creatinine was 1.44 mg percent.  [No priors] patient was evaluated by cardiology and they noticed that with labetalol his symptoms had improved.  Initial cardiac evaluation showed left arm and right arm systolic blood pressures to be equal but at the time of CCM evaluation on the right arm it was 145 systolic with a MAP of 125 but is in the left arm it was 165 systolic with a slightly more elevated map.  Patient is at [facility] emergency department but needs to be admitted to cardiac ICU at [facility].  CCM MD admitting and taking over from [facility] hospital[facility]. Regarding risk factors patient drinks alcohol 4 days/week in varying forms.  Uses marijuana but denies other illicit drugs.  There is concern for sleep apnea because he snores according to cardiology notes.  But has not been tested for sleep apnea. History of non-compliant with bp meds in past. Echo shows Systolic dysfunction - ef 25% with grade 3 diastolic dysfunction as well.   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 - Patient continues off labetalol gtt - Coreg,  Bidil ongoing - Cardiology following, appreciate insight and recommendations -cardiac cath in the next 24 to 48 hours pending clinical course kidney function and symptom control - awaiting renal function to improve prior to ACE/ARB initiation - UDS positive for THC only - Renal artery [place]  pending  Elevated troponin, ongoing -Likely demand ischemia given above-cardiology following as above planned cardiac catheter in the next 24 to 48 hours for further evaluation - Nonischemic EKG with profound LVH - Continue ASA, lipitor, heparin gtt   HLD -Continue atorvastatin 80  Mild anemia  -Likely exacerbating patient's troponin elevation as above -Continue to follow clinically, repeat morning labs -Transfuse if symptomatic or hemoglobin less than 7  AKI, secondary to above, ongoing -Hold nephrotoxic medications -Tentative cath in the next 24 to 48 hours pending improvement in creatinine and urinary output  - renal artery [place] pending as above Lab Results  Component Value Date   CREATININE 1.85 (H) 01/22/2020   CREATININE 1.89 (H) 01/21/2020   CREATININE 1.44 (H) 01/20/2020   Hypokalemia  Borderline Hypomagnesemia  -improving, follow morning labs  Low attenuation lesion within liver -54mm focus of parenchymal low attenuation within anterolateral aspect of liver dome -possible hemangioma  Plan -recommend non-urgent hepatic ultrasound. Can be ordered this admission    DVT prophylaxis: Heparin drip Code Status: Full Disposition Plan: Clinical course, patient remains inpatient for further work-up with cardiology including but not limited to renal ultrasound, cardiac catheterization, patient symptoms, labs, clinical status will need to stabilize and improve prior to discussion about discharge, likely ultimate discharge home  given patient's age and baseline status seems reasonable.   Consultants:   PCCM, cardiology  Procedures:   Cardiac catheterization pending  Subjective: No  acute issues or events overnight, denies nausea, vomiting, headache, fevers, chills, chest pain, shortness of breath.  Objective: Vitals:   01/22/20 0300 01/22/20 0400 01/22/20 0500 01/22/20 0600  BP:  134/74  (!) 141/85  Pulse: 71 67 77 67  Resp: 11 19 (!) 25 20  Temp: 98.9 F (37.2 C)     TempSrc: Oral     SpO2: 99% 100% 100% 99%  Weight:      Height:        Intake/Output Summary (Last 24 hours) at 01/22/2020 0700 Last data filed at 01/22/2020 0600 Gross per 24 hour  Intake 702.36 ml  Output 975 ml  Net -272.64 ml   Filed Weights   01/20/20 0744  Weight: 90.3 kg    Examination:  General exam: Appears calm and comfortable  Respiratory system: Clear to auscultation. Respiratory effort normal. Cardiovascular system: S1 & S2 heard, RRR. No JVD, murmurs, rubs, gallops or clicks. No pedal edema. Gastrointestinal system: Abdomen is nondistended, soft and nontender. No organomegaly or masses felt. Normal bowel sounds heard. Central nervous system: Alert and oriented. No focal neurological deficits. Extremities: Symmetric 5 x 5 power. Skin: No rashes, lesions or ulcers   Data Reviewed: I have personally reviewed following labs and imaging studies  CBC: Recent Labs  Lab 01/20/20 0754 01/21/20 0308 01/22/20 0125  WBC 6.6 7.5 7.7  NEUTROABS 4.6  --   --   HGB 12.8* 11.7* 11.0*  HCT 38.2* 34.3* 33.0*  MCV 87.6 85.5 85.9  PLT 211 212 564   Basic Metabolic Panel: Recent Labs  Lab 01/20/20 0754 01/21/20 0308 01/22/20 0125  NA 136 135 136  K 3.8 2.9* 3.3*  CL 103 100 102  CO2 23 24 23   GLUCOSE 128* 110* 102*  BUN 21* 17 17  CREATININE 1.44* 1.89* 1.85*  CALCIUM 9.0 9.0 8.9  MG  --  1.7 2.5*  PHOS  --  4.1 2.8   GFR: Estimated Creatinine Clearance: 61.2 mL/min (A) (by C-G formula based on SCr of 1.85 mg/dL (H)). Liver Function Tests: Recent Labs  Lab 01/20/20 0754 01/21/20 0308  AST 24 55*  ALT 21 21  ALKPHOS 52 40  BILITOT 0.8 1.0  PROT 7.4 6.5    ALBUMIN 4.2 3.6   No results for input(s): LIPASE, AMYLASE in the last 168 hours. No results for input(s): AMMONIA in the last 168 hours. Coagulation Profile: Recent Labs  Lab 01/20/20 1129  INR 0.9   Cardiac Enzymes: No results for input(s): CKTOTAL, CKMB, CKMBINDEX, TROPONINI in the last 168 hours. BNP (last 3 results) No results for input(s): PROBNP in the last 8760 hours. HbA1C: No results for input(s): HGBA1C in the last 72 hours. CBG: No results for input(s): GLUCAP in the last 168 hours. Lipid Profile: Recent Labs    01/21/20 0308  CHOL 251*  HDL 50  LDLCALC 181*  TRIG 99  CHOLHDL 5.0   Thyroid Function Tests: Recent Labs    01/20/20 1129  TSH 0.571  FREET4 1.07   Anemia Panel: No results for input(s): VITAMINB12, FOLATE, FERRITIN, TIBC, IRON, RETICCTPCT in the last 72 hours. Sepsis Labs: Recent Labs  Lab 01/21/20 0308 01/21/20 0548  LATICACIDVEN 1.4 1.2    Recent Results (from the past 240 hour(s))  SARS CORONAVIRUS 2 (TAT 6-24 HRS) Nasopharyngeal Nasopharyngeal Swab     Status:  None   Collection Time: 01/20/20 10:00 AM   Specimen: Nasopharyngeal Swab  Result Value Ref Range Status   SARS Coronavirus 2 NEGATIVE NEGATIVE Final    Comment: (NOTE) SARS-CoV-2 target nucleic acids are NOT DETECTED. The SARS-CoV-2 RNA is generally detectable in upper and lower respiratory specimens during the acute phase of infection. Negative results do not preclude SARS-CoV-2 infection, do not rule out co-infections with other pathogens, and should not be used as the sole basis for treatment or other patient management decisions. Negative results must be combined with clinical observations, patient history, and epidemiological information. The expected result is Negative. Fact Sheet for Patients: HairSlick.no Fact Sheet for Healthcare Providers: quierodirigir.com This test is not yet approved or cleared by the  [place] FDA and  has been authorized for detection and/or diagnosis of SARS-CoV-2 by FDA under an Emergency Use Authorization (EUA). This EUA will remain  in effect (meaning this test can be used) for the duration of the COVID-19 declaration under Section 56 4(b)(1) of the Act, 21 U.S.C. section 360bbb-3(b)(1), unless the authorization is terminated or revoked sooner. Performed at [facility], [address] [place], [place] [address]   MRSA PCR Screening     Status: None   Collection Time: 01/20/20  7:39 PM   Specimen: Nasopharyngeal  Result Value Ref Range Status   MRSA by PCR NEGATIVE NEGATIVE Final    Comment:        The GeneXpert MRSA Assay (FDA approved for NASAL specimens only), is one component of a comprehensive MRSA colonization surveillance program. It is not intended to diagnose MRSA infection nor to guide or monitor treatment for MRSA infections. Performed at [facility], [address], [place], [place] [address]          Radiology Studies: DG Chest 2 View  Result Date: 01/20/2020 CLINICAL DATA:  Chest pain and hypertension EXAM: CHEST - 2 VIEW COMPARISON:  None. FINDINGS: Borderline enlargement of the left ventricle. Interstitial opacity with [employer]. No effusion or pneumothorax. IMPRESSION: Pulmonary edema. Electronically Signed   By: Marnee Spring M.D.   On: 01/20/2020 08:56   CT CHEST WO CONTRAST  Result Date: 01/20/2020 CLINICAL DATA:  Chest pain. EXAM: CT CHEST WITHOUT CONTRAST TECHNIQUE: Multidetector CT imaging of the chest was performed following the standard protocol without IV contrast. COMPARISON:  None. FINDINGS: Cardiovascular: No significant vascular findings. Normal heart size. No pericardial effusion. Mediastinum/Nodes: No enlarged mediastinal or axillary lymph nodes. Thyroid gland, trachea, and esophagus demonstrate no significant findings. Lungs/Pleura: A trace amount of atelectasis is seen within the posterior  aspect of the bilateral lung bases. There is no evidence of acute infiltrate, pleural effusion or pneumothorax. Upper Abdomen: A 6 mm focus of parenchymal low attenuation is seen within the anterolateral aspect of the liver dome. Musculoskeletal: No chest wall mass or suspicious bone lesions identified. IMPRESSION: 1. No acute findings in the chest. 2. 6 mm focus of parenchymal low attenuation within the anterolateral aspect of the liver dome. This may represent a small hemangioma. Correlation with nonemergent hepatic ultrasound is recommended. Electronically Signed   By: Aram Candela M.D.   On: 01/20/2020 18:21   ECHOCARDIOGRAM COMPLETE  Result Date: 01/20/2020    ECHOCARDIOGRAM REPORT   Patient Name:   Jose Sharp Date of Exam: 01/20/2020 Medical Rec #:  [id number]         Height:       72.0 in Accession #:    [id number]  Weight:       199.0 lb Date of Birth:  03-29-1986        BSA:          2.126 m Patient Age:    33 years          BP:           162/121 mmHg Patient Gender: M                 HR:           73 bpm. Exam Location:  Inpatient Procedure: 2D Echo, Cardiac Doppler and Color Doppler Indications:    Chest pain  History:        Patient has no prior history of Echocardiogram examinations.                 Arrythmias:Abnormal EKG; Signs/Symptoms:Chest Pain.  Sonographer:    Lavenia Atlas Referring Phys: [id number] TOCHUKWU AGBATA IMPRESSIONS  1. Left ventricular ejection fraction, by estimation, is 25 to 30%. The left ventricle has severely decreased function. The left ventricle demonstrates global hypokinesis. There is moderate concentric left ventricular hypertrophy. Left ventricular diastolic parameters are consistent with Grade III diastolic dysfunction (restrictive). Elevated left atrial pressure.  2. Right ventricular systolic function is low normal. The right ventricular size is normal. There is mildly elevated pulmonary artery systolic pressure. The estimated right ventricular  systolic pressure is 37.8 mmHg.  3. Left atrial size was mildly dilated.  4. The mitral valve is grossly normal. Trivial mitral valve regurgitation. No evidence of mitral stenosis.  5. The aortic valve is tricuspid. Aortic valve regurgitation is not visualized. No aortic stenosis is present.  6. The inferior vena cava is normal in size with <50% respiratory variability, suggesting right atrial pressure of 8 mmHg. FINDINGS  Left Ventricle: Left ventricular ejection fraction, by estimation, is 25 to 30%. The left ventricle has severely decreased function. The left ventricle demonstrates global hypokinesis. The left ventricular internal cavity size was normal in size. There is moderate concentric left ventricular hypertrophy. Left ventricular diastolic parameters are consistent with Grade III diastolic dysfunction (restrictive). Elevated left atrial pressure. Right Ventricle: The right ventricular size is normal. No increase in right ventricular wall thickness. Right ventricular systolic function is low normal. There is mildly elevated pulmonary artery systolic pressure. The tricuspid regurgitant velocity is 2.73 m/s, and with an assumed right atrial pressure of 8 mmHg, the estimated right ventricular systolic pressure is 37.8 mmHg. Left Atrium: Left atrial size was mildly dilated. Right Atrium: Right atrial size was normal in size. Pericardium: There is no evidence of pericardial effusion. Mitral Valve: The mitral valve is grossly normal. Trivial mitral valve regurgitation. No evidence of mitral valve stenosis. Tricuspid Valve: The tricuspid valve is grossly normal. Tricuspid valve regurgitation is not demonstrated. No evidence of tricuspid stenosis. Aortic Valve: The aortic valve is tricuspid. Aortic valve regurgitation is not visualized. No aortic stenosis is present. Pulmonic Valve: The pulmonic valve was grossly normal. Pulmonic valve regurgitation is not visualized. No evidence of pulmonic stenosis. Aorta: The  aortic root is normal in size and structure. Venous: The inferior vena cava is normal in size with less than 50% respiratory variability, suggesting right atrial pressure of 8 mmHg. IAS/Shunts: No atrial level shunt detected by color flow Doppler.  LEFT VENTRICLE PLAX 2D LVIDd:         5.40 cm  Diastology LVIDs:         4.50 cm  LV e' lateral:  6.00 cm/s LV PW:         1.50 cm  LV E/e' lateral: 13.6 LV IVS:        1.50 cm  LV e' medial:    5.98 cm/s LVOT diam:     2.60 cm  LV E/e' medial:  13.6 LV SV:         73 LV SV Index:   34 LVOT Area:     5.31 cm  RIGHT VENTRICLE RV Basal diam:  2.60 cm RV S prime:     9.57 cm/s TAPSE (M-mode): 2.5 cm LEFT ATRIUM             Index       RIGHT ATRIUM           Index LA diam:        4.60 cm 2.16 cm/m  RA Area:     14.20 cm LA Vol (A2C):   67.0 ml 31.51 ml/m RA Volume:   35.00 ml  16.46 ml/m LA Vol (A4C):   68.6 ml 32.27 ml/m LA Biplane Vol: 68.7 ml 32.31 ml/m  AORTIC VALVE LVOT Vmax:   70.50 cm/s LVOT Vmean:  51.300 cm/s LVOT VTI:    0.137 m  AORTA Ao Root diam: 3.20 cm MITRAL VALVE               TRICUSPID VALVE MV Area (PHT): 4.49 cm    TR Peak grad:   29.8 mmHg MV Decel Time: 169 msec    TR Vmax:        273.00 cm/s MV E velocity: 81.40 cm/s MV A velocity: 38.10 cm/s  SHUNTS MV E/A ratio:  2.14        Systemic VTI:  0.14 m                            Systemic Diam: 2.60 cm Lennie Odor MD Electronically signed by Lennie Odor MD Signature Date/Time: 01/20/2020/3:13:38 PM    Final     Scheduled Meds: . aspirin EC  81 mg Oral Daily  . atorvastatin  80 mg Oral q1800  . carvedilol  25 mg Oral BID WC  . Chlorhexidine Gluconate Cloth  6 each Topical Daily  . isosorbide-hydrALAZINE  2 tablet Oral TID   Continuous Infusions: . heparin 1,150 Units/hr (01/22/20 0600)     LOS: 2 days    Time spent:  Azucena Fallen, DO [facility]  If 7PM-7AM, please contact night-coverage [email or web address]  01/22/2020, 7:00 AM

## 2020-01-22 NOTE — Progress Notes (Signed)
ANTICOAGULATION CONSULT NOTE   Pharmacy Consult for heparin Indication: chest pain/ACS  No Known Allergies  Patient Measurements: Height: 6' (182.9 cm) Weight: 199 lb (90.3 kg) IBW/kg (Calculated) : 77.6 Heparin Dosing Weight: 90.3kg  Vital Signs: Temp: 98.3 F (36.8 C) (03/27 2300) Temp Source: Oral (03/27 2300) BP: 141/83 (03/28 0200) Pulse Rate: 71 (03/28 0300)  Labs: Recent Labs    01/20/20 0754 01/20/20 0754 01/20/20 1011 01/20/20 1129 01/20/20 1258 01/20/20 2012 01/21/20 0308 01/21/20 0548 01/21/20 1128 01/22/20 0125  HGB 12.8*   < >  --   --   --   --  11.7*  --   --  11.0*  HCT 38.2*  --   --   --   --   --  34.3*  --   --  33.0*  PLT 211  --   --   --   --   --  212  --   --  201  APTT  --   --   --  20*  --   --   --   --   --   --   LABPROT  --   --   --  12.1  --   --   --   --   --   --   INR  --   --   --  0.9  --   --   --   --   --   --   HEPARINUNFRC  --   --   --   --   --  0.38  --   --  0.39 0.28*  CREATININE 1.44*  --   --   --   --   --  1.89*  --   --  1.85*  TROPONINIHS 145*  --    < > 347* 535*  --   --  6,378*  --   --    < > = values in this interval not displayed.    Estimated Creatinine Clearance: 61.2 mL/min (A) (by C-G formula based on SCr of 1.85 mg/dL (H)).   Medical History: Past Medical History:  Diagnosis Date  . Hypertension      Assessment: 34 y.o. male with medical history significant for hypertension who presents to the ER for evaluation of chest pain.  Pharmacy consulted to dose heparin.  No prior AC noted.   Heparin level low this AM at 0.28  Goal of Therapy:  Heparin level 0.3-0.7 units/ml Monitor platelets by anticoagulation protocol   Plan:  -Increase heparin to 1150 units / hr -Daily heparin level and CBC  Thank you Okey Regal, PharmD Clinical Pharmacist 01/22/2020 3:07 AM

## 2020-01-22 NOTE — Progress Notes (Signed)
Cardiology Progress Note  Patient ID: Jose Sharp MRN: [id number] DOB: [birth year] Date of Encounter: 01/22/2020  Primary Cardiologist: No primary care provider on file.  Subjective  Blood pressure better on Coreg and BiDil.  No complaints of chest pain or shortness of breath.  Creatinine has plateaued.  ROS:  All other ROS reviewed and negative. Pertinent positives noted in the HPI.     Inpatient Medications  Scheduled Meds: . aspirin EC  81 mg Oral Daily  . atorvastatin  80 mg Oral q1800  . carvedilol  25 mg Oral BID WC  . Chlorhexidine Gluconate Cloth  6 each Topical Daily  . isosorbide-hydrALAZINE  2 tablet Oral TID   Continuous Infusions: . heparin 1,150 Units/hr (01/22/20 0600)   PRN Meds: ondansetron **OR** ondansetron (ZOFRAN) IV   Vital Signs   Vitals:   01/22/20 0300 01/22/20 0400 01/22/20 0500 01/22/20 0600  BP:  134/74  (!) 141/85  Pulse: 71 67 77 67  Resp: 11 19 (!) 25 20  Temp: 98.9 F (37.2 C)     TempSrc: Oral     SpO2: 99% 100% 100% 99%  Weight:      Height:        Intake/Output Summary (Last 24 hours) at 01/22/2020 0720 Last data filed at 01/22/2020 0600 Gross per 24 hour  Intake 702.36 ml  Output 975 ml  Net -272.64 ml   Last 3 Weights 01/20/2020  Weight (lbs) 199 lb  Weight (kg) 90.266 kg      Telemetry  Overnight telemetry shows normal sinus rhythm with heart rate 60-70, which I personally reviewed.   ECG  The most recent ECG shows normal sinus rhythm heart rate 66, diffuse anterolateral T wave inversions, which I personally reviewed.   Physical Exam   Vitals:   01/22/20 0300 01/22/20 0400 01/22/20 0500 01/22/20 0600  BP:  134/74  (!) 141/85  Pulse: 71 67 77 67  Resp: 11 19 (!) 25 20  Temp: 98.9 F (37.2 C)     TempSrc: Oral     SpO2: 99% 100% 100% 99%  Weight:      Height:         Intake/Output Summary (Last 24 hours) at 01/22/2020 0720 Last data filed at 01/22/2020 0600 Gross per 24 hour  Intake 702.36 ml  Output  975 ml  Net -272.64 ml    Last 3 Weights 01/20/2020  Weight (lbs) 199 lb  Weight (kg) 90.266 kg    Body mass index is 26.99 kg/m.  General: Well nourished, well developed, in no acute distress Head: Atraumatic, normal size  Eyes: PEERLA, EOMI  Neck: Supple, no JVD Endocrine: No thryomegaly Cardiac: Normal S1, S2; RRR; no murmurs, rubs, or gallops Lungs: Clear to auscultation bilaterally, no wheezing, rhonchi or rales  Abd: Soft, nontender, no hepatomegaly  Ext: No edema, pulses 2+ Musculoskeletal: No deformities, BUE and BLE strength normal and equal Skin: Warm and dry, no rashes   Neuro: Alert and oriented to person, place, time, and situation, CNII-XII grossly intact, no focal deficits  Psych: Normal mood and affect   Labs  High Sensitivity Troponin:   Recent Labs  Lab 01/20/20 0754 01/20/20 1011 01/20/20 1129 01/20/20 1258 01/21/20 0548  TROPONINIHS 145* 260* 347* 535* 6,378*     Cardiac EnzymesNo results for input(s): TROPONINI in the last 168 hours. No results for input(s): TROPIPOC in the last 168 hours.  Chemistry Recent Labs  Lab 01/20/20 0754 01/21/20 0308 01/22/20 0125  NA 136 135 136  K 3.8 2.9* 3.3*  CL 103 100 102  CO2 23 24 23   GLUCOSE 128* 110* 102*  BUN 21* 17 17  CREATININE 1.44* 1.89* 1.85*  CALCIUM 9.0 9.0 8.9  PROT 7.4 6.5  --   ALBUMIN 4.2 3.6  --   AST 24 55*  --   ALT 21 21  --   ALKPHOS 52 40  --   BILITOT 0.8 1.0  --   GFRNONAA >60 45* 46*  GFRAA >60 52* 53*  ANIONGAP 10 11 11     Hematology Recent Labs  Lab 01/20/20 0754 01/21/20 0308 01/22/20 0125  WBC 6.6 7.5 7.7  RBC 4.36 4.01* 3.84*  HGB 12.8* 11.7* 11.0*  HCT 38.2* 34.3* 33.0*  MCV 87.6 85.5 85.9  MCH 29.4 29.2 28.6  MCHC 33.5 34.1 33.3  RDW 12.6 12.8 13.2  PLT 211 212 201   BNP Recent Labs  Lab 01/20/20 0754  BNP 216.7*    DDimer No results for input(s): DDIMER in the last 168 hours.   Radiology  DG Chest 2 View  Result Date: 01/20/2020 CLINICAL DATA:   Chest pain and hypertension EXAM: CHEST - 2 VIEW COMPARISON:  None. FINDINGS: Borderline enlargement of the left ventricle. Interstitial opacity with 01/22/20. No effusion or pneumothorax. IMPRESSION: Pulmonary edema. Electronically Signed   By: 01/22/2020 M.D.   On: 01/20/2020 08:56   CT CHEST WO CONTRAST  Result Date: 01/20/2020 CLINICAL DATA:  Chest pain. EXAM: CT CHEST WITHOUT CONTRAST TECHNIQUE: Multidetector CT imaging of the chest was performed following the standard protocol without IV contrast. COMPARISON:  None. FINDINGS: Cardiovascular: No significant vascular findings. Normal heart size. No pericardial effusion. Mediastinum/Nodes: No enlarged mediastinal or axillary lymph nodes. Thyroid gland, trachea, and esophagus demonstrate no significant findings. Lungs/Pleura: A trace amount of atelectasis is seen within the posterior aspect of the bilateral lung bases. There is no evidence of acute infiltrate, pleural effusion or pneumothorax. Upper Abdomen: A 6 mm focus of parenchymal low attenuation is seen within the anterolateral aspect of the liver dome. Musculoskeletal: No chest wall mass or suspicious bone lesions identified. IMPRESSION: 1. No acute findings in the chest. 2. 6 mm focus of parenchymal low attenuation within the anterolateral aspect of the liver dome. This may represent a small hemangioma. Correlation with nonemergent hepatic ultrasound is recommended. Electronically Signed   By: 01/22/2020 M.D.   On: 01/20/2020 18:21   ECHOCARDIOGRAM COMPLETE  Result Date: 01/20/2020    ECHOCARDIOGRAM REPORT   Patient Name:   Jose Sharp Date of Exam: 01/20/2020 Medical Rec #:  Jose Sharp         Height:       72.0 in Accession #:    01/22/2020        Weight:       199.0 lb Date of Birth:  1986-02-11        BSA:          2.126 m Patient Age:    33 years          BP:           162/121 mmHg Patient Gender: M                 HR:           73 bpm. Exam Location:  Inpatient Procedure:  2D Echo, Cardiac Doppler and Color Doppler Indications:    Chest pain  History:        Patient has no prior  history of Echocardiogram examinations.                 Arrythmias:Abnormal EKG; Signs/Symptoms:Chest Pain.  Sonographer:    Lavenia Atlas Referring Phys: [id number] TOCHUKWU AGBATA IMPRESSIONS  1. Left ventricular ejection fraction, by estimation, is 25 to 30%. The left ventricle has severely decreased function. The left ventricle demonstrates global hypokinesis. There is moderate concentric left ventricular hypertrophy. Left ventricular diastolic parameters are consistent with Grade III diastolic dysfunction (restrictive). Elevated left atrial pressure.  2. Right ventricular systolic function is low normal. The right ventricular size is normal. There is mildly elevated pulmonary artery systolic pressure. The estimated right ventricular systolic pressure is 37.8 mmHg.  3. Left atrial size was mildly dilated.  4. The mitral valve is grossly normal. Trivial mitral valve regurgitation. No evidence of mitral stenosis.  5. The aortic valve is tricuspid. Aortic valve regurgitation is not visualized. No aortic stenosis is present.  6. The inferior vena cava is normal in size with <50% respiratory variability, suggesting right atrial pressure of 8 mmHg. FINDINGS  Left Ventricle: Left ventricular ejection fraction, by estimation, is 25 to 30%. The left ventricle has severely decreased function. The left ventricle demonstrates global hypokinesis. The left ventricular internal cavity size was normal in size. There is moderate concentric left ventricular hypertrophy. Left ventricular diastolic parameters are consistent with Grade III diastolic dysfunction (restrictive). Elevated left atrial pressure. Right Ventricle: The right ventricular size is normal. No increase in right ventricular wall thickness. Right ventricular systolic function is low normal. There is mildly elevated pulmonary artery systolic pressure. The  tricuspid regurgitant velocity is 2.73 m/s, and with an assumed right atrial pressure of 8 mmHg, the estimated right ventricular systolic pressure is 37.8 mmHg. Left Atrium: Left atrial size was mildly dilated. Right Atrium: Right atrial size was normal in size. Pericardium: There is no evidence of pericardial effusion. Mitral Valve: The mitral valve is grossly normal. Trivial mitral valve regurgitation. No evidence of mitral valve stenosis. Tricuspid Valve: The tricuspid valve is grossly normal. Tricuspid valve regurgitation is not demonstrated. No evidence of tricuspid stenosis. Aortic Valve: The aortic valve is tricuspid. Aortic valve regurgitation is not visualized. No aortic stenosis is present. Pulmonic Valve: The pulmonic valve was grossly normal. Pulmonic valve regurgitation is not visualized. No evidence of pulmonic stenosis. Aorta: The aortic root is normal in size and structure. Venous: The inferior vena cava is normal in size with less than 50% respiratory variability, suggesting right atrial pressure of 8 mmHg. IAS/Shunts: No atrial level shunt detected by color flow Doppler.  LEFT VENTRICLE PLAX 2D LVIDd:         5.40 cm  Diastology LVIDs:         4.50 cm  LV e' lateral:   6.00 cm/s LV PW:         1.50 cm  LV E/e' lateral: 13.6 LV IVS:        1.50 cm  LV e' medial:    5.98 cm/s LVOT diam:     2.60 cm  LV E/e' medial:  13.6 LV SV:         73 LV SV Index:   34 LVOT Area:     5.31 cm  RIGHT VENTRICLE RV Basal diam:  2.60 cm RV S prime:     9.57 cm/s TAPSE (M-mode): 2.5 cm LEFT ATRIUM             Index       RIGHT ATRIUM  Index LA diam:        4.60 cm 2.16 cm/m  RA Area:     14.20 cm LA Vol (A2C):   67.0 ml 31.51 ml/m RA Volume:   35.00 ml  16.46 ml/m LA Vol (A4C):   68.6 ml 32.27 ml/m LA Biplane Vol: 68.7 ml 32.31 ml/m  AORTIC VALVE LVOT Vmax:   70.50 cm/s LVOT Vmean:  51.300 cm/s LVOT VTI:    0.137 m  AORTA Ao Root diam: 3.20 cm MITRAL VALVE               TRICUSPID VALVE MV Area (PHT):  4.49 cm    TR Peak grad:   29.8 mmHg MV Decel Time: 169 msec    TR Vmax:        273.00 cm/s MV E velocity: 81.40 cm/s MV A velocity: 38.10 cm/s  SHUNTS MV E/A ratio:  2.14        Systemic VTI:  0.14 m                            Systemic Diam: 2.60 cm Eleonore Chiquito MD Electronically signed by Eleonore Chiquito MD Signature Date/Time: 01/20/2020/3:13:38 PM    Final     Cardiac Studies  TTE 01/20/2020 1. Left ventricular ejection fraction, by estimation, is 25 to 30%. The  left ventricle has severely decreased function. The left ventricle  demonstrates global hypokinesis. There is moderate concentric left  ventricular hypertrophy. Left ventricular  diastolic parameters are consistent with Grade III diastolic dysfunction  (restrictive). Elevated left atrial pressure.  2. Right ventricular systolic function is low normal. The right  ventricular size is normal. There is mildly elevated pulmonary artery  systolic pressure. The estimated right ventricular systolic pressure is  16.1 mmHg.  3. Left atrial size was mildly dilated.  4. The mitral valve is grossly normal. Trivial mitral valve  regurgitation. No evidence of mitral stenosis.  5. The aortic valve is tricuspid. Aortic valve regurgitation is not  visualized. No aortic stenosis is present.  6. The inferior vena cava is normal in size with <50% respiratory  variability, suggesting right atrial pressure of 8 mmHg.   Patient Profile  Jose Sharp is a 34 y.o. male with history of hypertension who was admitted with hypertensive emergency with chest pain on 01/20/2020.  Echocardiogram shows low EF.  Troponins up to 6300.  Assessment & Plan  1.  Hypertensive emergency -Weaned off labetalol drip.  Echocardiogram with EF of 25-30.  Troponin was increased up to 6300. EKG demonstrates LVH with diffuse anterolateral T wave inversions.  There are markedly pronounced.  He denies chest pain today. -I have suspect this is just hypertensive crisis  with secondary demand ischemia.  Troponin rise is concerning. -We will continue aspirin, heparin drip, high intensity statin for now. -We will tentatively plan for left heart catheterization tomorrow.  As long as his kidney function continue to improve I think will be okay. -UDS negative for cocaine.  It is positive for THC. -Secondary work-up: Plasma nephro is pending, Reinaldo level pending.  We have also ordered a renal artery duplex which will be performed tomorrow as he is n.p.o. tomorrow. -Thyroid studies normal -We will plan to continue Coreg 25 mg twice daily and BiDil 20-37.5 2 tabs twice daily. -I will get him on ACE/ARB/Arni and Aldactone once his kidney function improves  2. Elevated troponin, non-STEMI versus demand ischemia -Troponins have increased up to 6378.  EKG with pronounced anterior lateral T wave changes.  There is no ST elevation.  His EKG likely just represents profound LVH.  He has no apical variant hypertrophic cardiomyopathy on his echocardiogram.  His EF is 25-30% with global hypokinesis and no regional wall motion normalities. -Likely secondary to hypertensive crisis. -We will exclude effective CAD tomorrow with left heart catheterization.  N.p.o. midnight.  3.  New onset systolic heart failure, ejection fraction 25-30% -Likely secondary to hypertension.  Discussion above regarding elevated troponin and need for left heart catheterization prior to discharge. -Normal thyroid studies.  Secondary work-up for hypertension as above. -Left heart catheterization tomorrow to rule out obstructive CAD -Euvolemic on examination.  No need for Lasix. -Continue Coreg and BiDil as above.  We will add ACE/ARB/Arni plus minus Aldactone tomorrow pending improvement in kidney function.  4. HLD -Most recent LDL 181.  I have increased his Lipitor to 80 mg daily.  For questions or updates, please contact [facility] Please consult [email or web address] for contact info under   Time  Spent with Patient: I have spent a total of 25 minutes with patient reviewing hospital notes, telemetry, EKGs, labs and examining the patient as well as establishing an assessment and plan that was discussed with the patient.  > 50% of time was spent in direct patient care.    Signed, Lenna Gilford. Flora Lipps, MD [facility]  [facility]  01/22/2020 7:20 AM

## 2020-01-23 ENCOUNTER — Inpatient Hospital Stay (HOSPITAL_COMMUNITY): Payer: Medicaid Other

## 2020-01-23 ENCOUNTER — Encounter: Payer: Self-pay | Admitting: Cardiology

## 2020-01-23 ENCOUNTER — Encounter (HOSPITAL_COMMUNITY)
Admission: EM | Disposition: A | Discharge status: Home / Self Care | Payer: Self-pay | Source: Home / Self Care | Attending: Internal Medicine

## 2020-01-23 DIAGNOSIS — I160 Hypertensive urgency: Secondary | ICD-10-CM

## 2020-01-23 DIAGNOSIS — E785 Hyperlipidemia, unspecified: Secondary | ICD-10-CM

## 2020-01-23 DIAGNOSIS — I5020 Unspecified systolic (congestive) heart failure: Secondary | ICD-10-CM

## 2020-01-23 DIAGNOSIS — I2510 Atherosclerotic heart disease of native coronary artery without angina pectoris: Secondary | ICD-10-CM

## 2020-01-23 HISTORY — PX: RIGHT/LEFT HEART CATH AND CORONARY ANGIOGRAPHY: CATH118266

## 2020-01-23 LAB — POCT I-STAT EG7
Acid-base deficit: 3 mmol/L — ABNORMAL HIGH (ref 0.0–2.0)
Bicarbonate: 22.1 mmol/L (ref 20.0–28.0)
Calcium, Ion: 1.26 mmol/L (ref 1.15–1.40)
HCT: 31 % — ABNORMAL LOW (ref 39.0–52.0)
Hemoglobin: 10.5 g/dL — ABNORMAL LOW (ref 13.0–17.0)
O2 Saturation: 82 %
Potassium: 3.4 mmol/L — ABNORMAL LOW (ref 3.5–5.1)
Sodium: 140 mmol/L (ref 135–145)
TCO2: 23 mmol/L (ref 22–32)
pCO2, Ven: 37.2 mmHg — ABNORMAL LOW (ref 44.0–60.0)
pH, Ven: 7.382 (ref 7.250–7.430)
pO2, Ven: 47 mmHg — ABNORMAL HIGH (ref 32.0–45.0)

## 2020-01-23 LAB — POCT I-STAT 7, (LYTES, BLD GAS, ICA,H+H)
Acid-base deficit: 2 mmol/L (ref 0.0–2.0)
Bicarbonate: 22.4 mmol/L (ref 20.0–28.0)
Calcium, Ion: 1.24 mmol/L (ref 1.15–1.40)
HCT: 30 % — ABNORMAL LOW (ref 39.0–52.0)
Hemoglobin: 10.2 g/dL — ABNORMAL LOW (ref 13.0–17.0)
O2 Saturation: 100 %
Potassium: 3.4 mmol/L — ABNORMAL LOW (ref 3.5–5.1)
Sodium: 140 mmol/L (ref 135–145)
TCO2: 24 mmol/L (ref 22–32)
pCO2 arterial: 36.3 mmHg (ref 32.0–48.0)
pH, Arterial: 7.398 (ref 7.350–7.450)
pO2, Arterial: 187 mmHg — ABNORMAL HIGH (ref 83.0–108.0)

## 2020-01-23 LAB — BASIC METABOLIC PANEL
Anion gap: 10 (ref 5–15)
BUN: 13 mg/dL (ref 6–20)
CO2: 23 mmol/L (ref 22–32)
Calcium: 9.2 mg/dL (ref 8.9–10.3)
Chloride: 105 mmol/L (ref 98–111)
Creatinine, Ser: 1.42 mg/dL — ABNORMAL HIGH (ref 0.61–1.24)
GFR calc Af Amer: >60 mL/min (ref >60)
GFR calc non Af Amer: >60 mL/min (ref >60)
Glucose, Bld: 102 mg/dL — ABNORMAL HIGH (ref 70–99)
Potassium: 3.5 mmol/L (ref 3.5–5.1)
Sodium: 138 mmol/L (ref 135–145)

## 2020-01-23 LAB — PHOSPHORUS: Phosphorus: 3.6 mg/dL (ref 2.5–4.6)

## 2020-01-23 LAB — HEPARIN LEVEL (UNFRACTIONATED): Heparin Unfractionated: 0.38 IU/mL (ref 0.30–0.70)

## 2020-01-23 LAB — CBC
HCT: 33.8 % — ABNORMAL LOW (ref 39.0–52.0)
Hemoglobin: 11.2 g/dL — ABNORMAL LOW (ref 13.0–17.0)
MCH: 28.9 pg (ref 26.0–34.0)
MCHC: 33.1 g/dL (ref 30.0–36.0)
MCV: 87.1 fL (ref 80.0–100.0)
Platelets: 220 10*3/uL (ref 150–400)
RBC: 3.88 MIL/uL — ABNORMAL LOW (ref 4.22–5.81)
RDW: 13.1 % (ref 11.5–15.5)
WBC: 7.8 10*3/uL (ref 4.0–10.5)
nRBC: 0 % (ref 0.0–0.2)

## 2020-01-23 LAB — MAGNESIUM: Magnesium: 2.2 mg/dL (ref 1.7–2.4)

## 2020-01-23 SURGERY — RIGHT/LEFT HEART CATH AND CORONARY ANGIOGRAPHY
Anesthesia: LOCAL

## 2020-01-23 MED ORDER — INFLUENZA VAC SPLIT QUAD 0.5 ML IM SUSY: 0.5000 mL | PREFILLED_SYRINGE | INTRAMUSCULAR | Status: DC

## 2020-01-23 MED ORDER — SODIUM CHLORIDE 0.9% FLUSH
3.0000 mL | Freq: Two times a day (BID) | INTRAVENOUS | Status: DC
  Administered 2020-01-23: 3 mL via INTRAVENOUS

## 2020-01-23 MED ORDER — HEPARIN (PORCINE) IN NACL 1000-0.9 UT/500ML-% IV SOLN
INTRAVENOUS | Status: DC | PRN
  Administered 2020-01-23 (×2): 500 mL

## 2020-01-23 MED ORDER — HEPARIN SODIUM (PORCINE) 1000 UNIT/ML IJ SOLN
INTRAMUSCULAR | Status: DC | PRN
  Administered 2020-01-23: 4500 [IU] via INTRAVENOUS

## 2020-01-23 MED ORDER — SODIUM CHLORIDE 0.9% FLUSH: 3.0000 mL | Freq: Two times a day (BID) | INTRAVENOUS | Status: DC

## 2020-01-23 MED ORDER — POTASSIUM CHLORIDE CRYS ER 20 MEQ PO TBCR
40.0000 meq | EXTENDED_RELEASE_TABLET | Freq: Once | ORAL | Status: AC
  Administered 2020-01-23: 40 meq via ORAL
  Filled 2020-01-23: qty 2

## 2020-01-23 MED ORDER — VERAPAMIL HCL 2.5 MG/ML IV SOLN
INTRAVENOUS | Status: DC | PRN
  Administered 2020-01-23: 10 mL via INTRA_ARTERIAL

## 2020-01-23 MED ORDER — CLOPIDOGREL BISULFATE 75 MG PO TABS
75.0000 mg | ORAL_TABLET | Freq: Every day | ORAL | Status: DC
  Administered 2020-01-23 – 2020-01-24 (×2): 75 mg via ORAL
  Filled 2020-01-23 (×2): qty 1

## 2020-01-23 MED ORDER — SODIUM CHLORIDE 0.9 % IV SOLN: INTRAVENOUS | Status: DC

## 2020-01-23 MED ORDER — FENTANYL CITRATE (PF) 100 MCG/2ML IJ SOLN
INTRAMUSCULAR | Status: AC
  Filled 2020-01-23: qty 2

## 2020-01-23 MED ORDER — SPIRONOLACTONE 12.5 MG HALF TABLET
12.5000 mg | ORAL_TABLET | Freq: Every day | ORAL | Status: DC
  Administered 2020-01-23: 12.5 mg via ORAL
  Filled 2020-01-23: qty 1

## 2020-01-23 MED ORDER — HEPARIN (PORCINE) IN NACL 1000-0.9 UT/500ML-% IV SOLN
INTRAVENOUS | Status: AC
  Filled 2020-01-23: qty 1000

## 2020-01-23 MED ORDER — MIDAZOLAM HCL 2 MG/2ML IJ SOLN
INTRAMUSCULAR | Status: AC
  Filled 2020-01-23: qty 2

## 2020-01-23 MED ORDER — SODIUM CHLORIDE 0.9 % IV SOLN: 250.0000 mL | INTRAVENOUS | Status: DC | PRN

## 2020-01-23 MED ORDER — FENTANYL CITRATE (PF) 100 MCG/2ML IJ SOLN
INTRAMUSCULAR | Status: DC | PRN
  Administered 2020-01-23: 25 ug via INTRAVENOUS

## 2020-01-23 MED ORDER — VERAPAMIL HCL 2.5 MG/ML IV SOLN
INTRAVENOUS | Status: AC
  Filled 2020-01-23: qty 2

## 2020-01-23 MED ORDER — FUROSEMIDE 10 MG/ML IJ SOLN
40.0000 mg | Freq: Once | INTRAMUSCULAR | Status: AC
  Administered 2020-01-23: 40 mg via INTRAVENOUS
  Filled 2020-01-23: qty 4

## 2020-01-23 MED ORDER — ACETAMINOPHEN 325 MG PO TABS: 650.0000 mg | ORAL_TABLET | ORAL | Status: DC | PRN

## 2020-01-23 MED ORDER — SACUBITRIL-VALSARTAN 49-51 MG PO TABS
1.0000 | ORAL_TABLET | Freq: Two times a day (BID) | ORAL | Status: DC
  Administered 2020-01-23 – 2020-01-24 (×2): 1 via ORAL
  Filled 2020-01-23 (×2): qty 1

## 2020-01-23 MED ORDER — LABETALOL HCL 5 MG/ML IV SOLN: 10.0000 mg | INTRAVENOUS | Status: AC | PRN

## 2020-01-23 MED ORDER — HYDRALAZINE HCL 20 MG/ML IJ SOLN: 10.0000 mg | INTRAMUSCULAR | Status: AC | PRN

## 2020-01-23 MED ORDER — LIDOCAINE HCL (PF) 1 % IJ SOLN
INTRAMUSCULAR | Status: AC
  Filled 2020-01-23: qty 30

## 2020-01-23 MED ORDER — IOHEXOL 350 MG/ML SOLN
INTRAVENOUS | Status: DC | PRN
  Administered 2020-01-23: 85 mL

## 2020-01-23 MED ORDER — LIDOCAINE HCL (PF) 1 % IJ SOLN
INTRAMUSCULAR | Status: DC | PRN
  Administered 2020-01-23 (×2): 2 mL

## 2020-01-23 MED ORDER — HEPARIN SODIUM (PORCINE) 1000 UNIT/ML IJ SOLN
INTRAMUSCULAR | Status: AC
  Filled 2020-01-23: qty 1

## 2020-01-23 MED ORDER — MIDAZOLAM HCL 2 MG/2ML IJ SOLN
INTRAMUSCULAR | Status: DC | PRN
  Administered 2020-01-23: 2 mg via INTRAVENOUS

## 2020-01-23 MED ORDER — SODIUM CHLORIDE 0.9% FLUSH: 3.0000 mL | INTRAVENOUS | Status: DC | PRN

## 2020-01-23 MED ORDER — LOSARTAN POTASSIUM 50 MG PO TABS
100.0000 mg | ORAL_TABLET | Freq: Every day | ORAL | Status: DC
  Filled 2020-01-23: qty 2

## 2020-01-23 MED ORDER — ASPIRIN 81 MG PO CHEW: 81.0000 mg | CHEWABLE_TABLET | ORAL | Status: DC

## 2020-01-23 MED ORDER — ENOXAPARIN SODIUM 40 MG/0.4ML ~~LOC~~ SOLN
40.0000 mg | SUBCUTANEOUS | Status: DC
  Administered 2020-01-24: 40 mg via SUBCUTANEOUS
  Filled 2020-01-23: qty 0.4

## 2020-01-23 SURGICAL SUPPLY — 13 items
CATH 5FR JL3.5 JR4 ANG PIG MP (CATHETERS) ×1 IMPLANT
CATH BALLN WEDGE 5F 110CM (CATHETERS) ×1 IMPLANT
CATH INFINITI 5FR AL1 (CATHETERS) ×1 IMPLANT
DEVICE RAD COMP TR BAND LRG (VASCULAR PRODUCTS) ×1 IMPLANT
GLIDESHEATH SLEND SS 6F .021 (SHEATH) ×1 IMPLANT
GUIDEWIRE .025 260CM (WIRE) ×1 IMPLANT
GUIDEWIRE INQWIRE 1.5J.035X260 (WIRE) IMPLANT
INQWIRE 1.5J .035X260CM (WIRE) ×2
KIT HEART LEFT (KITS) ×2 IMPLANT
PACK CARDIAC CATHETERIZATION (CUSTOM PROCEDURE TRAY) ×2 IMPLANT
SHEATH GLIDE SLENDER 4/5FR (SHEATH) ×1 IMPLANT
TRANSDUCER W/STOPCOCK (MISCELLANEOUS) ×2 IMPLANT
TUBING CIL FLEX 10 FLL-RA (TUBING) ×2 IMPLANT

## 2020-01-23 NOTE — Progress Notes (Signed)
Bilateral renal artery duplex exam completed.  Preliminary results can be found under CV proc under chart review.  01/23/2020 10:01 AM  Shamel Germond, K., RDMS, RVT

## 2020-01-23 NOTE — Progress Notes (Signed)
Cardiology Progress Note  Patient ID: Jose Sharp MRN: [id number] DOB: April 28, 1986 Date of Encounter: 01/23/2020  Primary Cardiologist: No primary care provider on file.  Subjective  Blood pressure well controlled.  Denies chest pain or shortness of breath.  Renal artery duplex today.  Left heart cath today.  ROS:  All other ROS reviewed and negative. Pertinent positives noted in the HPI.     Inpatient Medications  Scheduled Meds: . aspirin EC  81 mg Oral Daily  . atorvastatin  80 mg Oral q1800  . carvedilol  25 mg Oral BID WC  . Chlorhexidine Gluconate Cloth  6 each Topical Daily  . [START ON 01/24/2020] influenza vac split quadrivalent PF  0.5 mL Intramuscular Tomorrow-1000  . isosorbide-hydrALAZINE  2 tablet Oral TID   Continuous Infusions: . heparin 1,150 Units/hr (01/23/20 0535)   PRN Meds: ondansetron **OR** ondansetron (ZOFRAN) IV   Vital Signs   Vitals:   01/22/20 1946 01/23/20 0032 01/23/20 0453 01/23/20 0824  BP: (!) 141/82 (!) 147/86 (!) 142/79 140/85  Pulse: 82 81 78 78  Resp: 16 17 18 19   Temp: 98.4 F (36.9 C) 99.3 F (37.4 C) 98.7 F (37.1 C) 98.8 F (37.1 C)  TempSrc: Oral Oral Oral Oral  SpO2: 98% 98% 97% 99%  Weight:   91 kg   Height:        Intake/Output Summary (Last 24 hours) at 01/23/2020 0918 Last data filed at 01/23/2020 0500 Gross per 24 hour  Intake 721.27 ml  Output 700 ml  Net 21.27 ml   Last 3 Weights 01/23/2020 01/22/2020 01/20/2020  Weight (lbs) 200 lb 9.6 oz 202 lb 199 lb  Weight (kg) 90.992 kg 91.627 kg 90.266 kg      Telemetry  Overnight telemetry shows normal sinus rhythm with heart rate 60-70, which I personally reviewed.   ECG  The most recent ECG shows normal sinus rhythm heart rate 66, diffuse anterolateral T wave inversions, which I personally reviewed.   Physical Exam   Vitals:   01/22/20 1946 01/23/20 0032 01/23/20 0453 01/23/20 0824  BP: (!) 141/82 (!) 147/86 (!) 142/79 140/85  Pulse: 82 81 78 78  Resp:  16 17 18 19   Temp: 98.4 F (36.9 C) 99.3 F (37.4 C) 98.7 F (37.1 C) 98.8 F (37.1 C)  TempSrc: Oral Oral Oral Oral  SpO2: 98% 98% 97% 99%  Weight:   91 kg   Height:         Intake/Output Summary (Last 24 hours) at 01/23/2020 0918 Last data filed at 01/23/2020 0500 Gross per 24 hour  Intake 721.27 ml  Output 700 ml  Net 21.27 ml    Last 3 Weights 01/23/2020 01/22/2020 01/20/2020  Weight (lbs) 200 lb 9.6 oz 202 lb 199 lb  Weight (kg) 90.992 kg 91.627 kg 90.266 kg    Body mass index is 27.21 kg/m.  General: Well nourished, well developed, in no acute distress Head: Atraumatic, normal size  Eyes: PEERLA, EOMI  Neck: Supple, no JVD Endocrine: No thryomegaly Cardiac: Normal S1, S2; RRR; no murmurs, rubs, or gallops Lungs: Clear to auscultation bilaterally, no wheezing, rhonchi or rales  Abd: Soft, nontender, no hepatomegaly  Ext: No edema, pulses 2+ Musculoskeletal: No deformities, BUE and BLE strength normal and equal Skin: Warm and dry, no rashes   Neuro: Alert and oriented to person, place, time, and situation, CNII-XII grossly intact, no focal deficits  Psych: Normal mood and affect   Labs  High Sensitivity Troponin:   Recent  Labs  Lab 01/20/20 1011 01/20/20 1129 01/20/20 1258 01/21/20 0548 01/22/20 0552  TROPONINIHS 260* 347* 535* 6,378* 6,397*     Cardiac EnzymesNo results for input(s): TROPONINI in the last 168 hours. No results for input(s): TROPIPOC in the last 168 hours.  Chemistry Recent Labs  Lab 01/20/20 0754 01/20/20 0754 01/21/20 0308 01/22/20 0125 01/23/20 0427  NA 136   < > 135 136 138  K 3.8   < > 2.9* 3.3* 3.5  CL 103   < > 100 102 105  CO2 23   < > 24 23 23   GLUCOSE 128*   < > 110* 102* 102*  BUN 21*   < > 17 17 13   CREATININE 1.44*   < > 1.89* 1.85* 1.42*  CALCIUM 9.0   < > 9.0 8.9 9.2  PROT 7.4  --  6.5  --   --   ALBUMIN 4.2  --  3.6  --   --   AST 24  --  55*  --   --   ALT 21  --  21  --   --   ALKPHOS 52  --  40  --   --     BILITOT 0.8  --  1.0  --   --   GFRNONAA >60   < > 45* 46* >60  GFRAA >60   < > 52* 53* >60  ANIONGAP 10   < > 11 11 10    < > = values in this interval not displayed.    Hematology Recent Labs  Lab 01/21/20 0308 01/22/20 0125 01/23/20 0427  WBC 7.5 7.7 7.8  RBC 4.01* 3.84* 3.88*  HGB 11.7* 11.0* 11.2*  HCT 34.3* 33.0* 33.8*  MCV 85.5 85.9 87.1  MCH 29.2 28.6 28.9  MCHC 34.1 33.3 33.1  RDW 12.8 13.2 13.1  PLT 212 201 220   BNP Recent Labs  Lab 01/20/20 0754  BNP 216.7*    DDimer No results for input(s): DDIMER in the last 168 hours.   Radiology  No results found.  Cardiac Studies  TTE 01/20/2020 1. Left ventricular ejection fraction, by estimation, is 25 to 30%. The  left ventricle has severely decreased function. The left ventricle  demonstrates global hypokinesis. There is moderate concentric left  ventricular hypertrophy. Left ventricular  diastolic parameters are consistent with Grade III diastolic dysfunction  (restrictive). Elevated left atrial pressure.  2. Right ventricular systolic function is low normal. The right  ventricular size is normal. There is mildly elevated pulmonary artery  systolic pressure. The estimated right ventricular systolic pressure is  37.8 mmHg.  3. Left atrial size was mildly dilated.  4. The mitral valve is grossly normal. Trivial mitral valve  regurgitation. No evidence of mitral stenosis.  5. The aortic valve is tricuspid. Aortic valve regurgitation is not  visualized. No aortic stenosis is present.  6. The inferior vena cava is normal in size with <50% respiratory  variability, suggesting right atrial pressure of 8 mmHg.   Patient Profile  Jose Sharp is a 34 y.o. male with history of hypertension who was admitted with hypertensive emergency with chest pain on 01/20/2020.  Echocardiogram shows low EF.  Troponins up to 6300.  Assessment & Plan  1.  Hypertensive emergency -Blood pressure is stable on Coreg and  BiDil.   -Ejection fraction 25-30% with global hypokinesis. -Troponin elevation up to 6300.  EKG with diffuse anterolateral T inversions.  Unclear if this is demand ischemia versus actual non-STEMI.  No chest pain. -Blood pressure well controlled today.  Continue Coreg and BiDil.  I likely will transition him to an ACE/ARB/Arni tomorrow. -Secondary work-up: UDS negative for cocaine.  Is positive for THC.  Plasma nephro is pending, Reinaldo level pending.  Renal artery duplex today. Thyroid studies normal  2. Elevated troponin, non-STEMI versus demand ischemia -Troponins have increased up to 6378.  EKG with pronounced anterior lateral T wave changes.  There is no ST elevation.  His EKG likely just represents profound LVH.  He has no apical variant hypertrophic cardiomyopathy on his echocardiogram.  His EF is 25-30% with global hypokinesis and no regional wall motion normalities. -Likely secondary to hypertensive crisis. -We will exclude effective CAD tomorrow with left heart catheterization.  N.p.o. midnight. -Continue aspirin, high intensity statin, heparin drip for now.  3.  New onset systolic heart failure, ejection fraction 25-30% -Likely secondary to hypertension.  Discussion above regarding elevated troponin and need for left heart catheterization prior to discharge. -Normal thyroid studies.  Secondary work-up for hypertension as above. -Left heart catheterization tomorrow to rule out obstructive CAD -Euvolemic on examination.  No need for Lasix. -Continue Coreg and BiDil as above.  We will add ACE/ARB/Arni plus minus Aldactone after LHC.  4. HLD -Most recent LDL 181.  I have increased his Lipitor to 80 mg daily.  For questions or updates, please contact [facility] Please consult [email or web address] for contact info under   Time Spent with Patient: I have spent a total of 25 minutes with patient reviewing hospital notes, telemetry, EKGs, labs and examining the patient as well as  establishing an assessment and plan that was discussed with the patient.  > 50% of time was spent in direct patient care.    Signed, Lenna Gilford. Flora Lipps, MD [facility]  [facility]  01/23/2020 9:18 AM

## 2020-01-23 NOTE — Progress Notes (Addendum)
PROGRESS NOTE    Jose Sharp  EVO:[id number] DOB: [birth year] DOA: 01/20/2020 PCP: System, Pcp Not In   Brief Narrative:  34 year old male with a history significant for hypertension, woke up early hours of January 20, 2020 due to chest pain and indigestion.  There was associated shortness of breath.  Symptoms got worse with lying down.  There is no headache or dizziness or lightheadedness abdominal pain or urinary symptoms or stroke symptoms.  In the emergency department his systolic blood pressure was 251/179.  Troponin was slightly elevated.  Labetalol infusion was started and the systolic blood pressure was ranging anywhere from 140-160.  His creatinine was 1.44 mg percent.  [No priors] patient was evaluated by cardiology and they noticed that with labetalol his symptoms had improved.  Initial cardiac evaluation showed left arm and right arm systolic blood pressures to be equal but at the time of CCM evaluation on the right arm it was 145 systolic with a MAP of 125 but is in the left arm it was 165 systolic with a slightly more elevated map.  Patient is at [facility] long emergency department but needs to be admitted to cardiac ICU at [facility].  CCM MD admitting and taking over from [facility] hospitalist. Regarding risk factors patient drinks alcohol 4 days/week in varying forms.  Uses marijuana but denies other illicit drugs.  There is concern for sleep apnea because he snores according to cardiology notes.  But has not been tested for sleep apnea. History of non-compliant with bp meds in past. Echo shows Systolic dysfunction - ef 25% with grade 3 diastolic dysfunction as well.   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  Prox RCA lesion is 50% stenosed.  Dist RCA lesion is 45% stenosed.  RPAV lesion is 100% stenosed.  LV end diastolic pressure is mildly elevated.  Hemodynamic findings consistent with mild pulmonary hypertension - Cardiology following as above - defer to their expertise - Continue ASA, lipitor - heparin gtt stopped prior to cath  HLD -Continue atorvastatin 80  Mild anemia  -Likely exacerbating patient's troponin elevation as above -Continue to follow clinically, repeat morning labs -Transfuse if symptomatic or hemoglobin less than 7  AKI, secondary to above, resolving -Hold nephrotoxic medications -Tentative cath in the next 24 to 48 hours pending improvement in creatinine and urinary output  - renal artery US performed as above, formal read pending Lab Results  Component Value Date   CREATININE 1.42 (H) 01/23/2020   CREATININE 1.85 (H) 01/22/2020   CREATININE 1.89 (H) 01/21/2020   Hypokalemia, stable Borderline Hypomagnesemia  -resolving, follow morning labs  Low attenuation lesion within liver -48mm focus of parenchymal low attenuation within anterolateral aspect of liver dome -possible hemangioma  Plan -recommend non-urgent hepatic ultrasound/imaging in the outpatient setting  DVT prophylaxis: Heparin drip Code  Status: Full Disposition Plan: Clinical course, patient remains inpatient for further work-up with cardiology including but not limited to renal ultrasound, cardiac catheterization, patient  symptoms, labs, clinical status will need to stabilize and improve prior to discussion about discharge, likely ultimate discharge home given patient's age and baseline status seems reasonable.   Consultants:   PCCM, cardiology  Procedures:   Cardiac catheterization   Prox LAD lesion is 30% stenosed.  Prox RCA lesion is 50% stenosed.  Dist RCA lesion is 45% stenosed.  RPAV lesion is 100% stenosed.  LV end diastolic pressure is mildly elevated.  Hemodynamic findings consistent with mild pulmonary hypertension  Subjective: No acute issues or events overnight, denies nausea, vomiting, headache, fevers, chills, chest pain, shortness of breath.   Objective: Vitals:   01/23/20 1630 01/23/20 1634 01/23/20 1635 01/23/20 1637  BP: (!) 178/102 (!) 178/102 (!) 192/91 (!) 164/89  Pulse: 79  76 82  Resp:   18 18  Temp:   98.1 F (36.7 C) 98.1 F (36.7 C)  TempSrc:   Oral Oral  SpO2:   100% 99%  Weight:      Height:        Intake/Output Summary (Last 24 hours) at 01/23/2020 1728 Last data filed at 01/23/2020 1616 Gross per 24 hour  Intake 620.75 ml  Output 150 ml  Net 470.75 ml   Filed Weights   01/20/20 0744 01/22/20 1556 01/23/20 0453  Weight: 90.3 kg 91.6 kg 91 kg    Examination:  General exam: Resting comfortably in bed, no acute distress Respiratory system: Clear to auscultation. Respiratory effort normal. Cardiovascular system: S1 & S2 heard, RRR. No JVD, murmurs, rubs, gallops or clicks. No pedal edema. Gastrointestinal system: Abdomen is nondistended, soft and nontender. No organomegaly or masses felt. Normal bowel sounds heard. Central nervous system: Alert and oriented. No focal neurological deficits. Extremities: Symmetric 5 x 5 power. Skin: No rashes, lesions or ulcers   Data Reviewed: I have personally reviewed following labs and imaging studies  CBC: Recent Labs  Lab 01/20/20 0754 01/21/20 0308 01/22/20 0125 01/23/20 0427  WBC 6.6 7.5 7.7 7.8    NEUTROABS 4.6  --   --   --   HGB 12.8* 11.7* 11.0* 11.2*  HCT 38.2* 34.3* 33.0* 33.8*  MCV 87.6 85.5 85.9 87.1  PLT 211 212 201 220   Basic Metabolic Panel: Recent Labs  Lab 01/20/20 0754 01/21/20 0308 01/22/20 0125 01/23/20 0427  NA 136 135 136 138  K 3.8 2.9* 3.3* 3.5  CL 103 100 102 105  CO2 23 24 23 23   GLUCOSE 128* 110* 102* 102*  BUN 21* 17 17 13   CREATININE 1.44* 1.89* 1.85* 1.42*  CALCIUM 9.0 9.0 8.9 9.2  MG  --  1.7 2.5* 2.2  PHOS  --  4.1 2.8 3.6   GFR: Estimated Creatinine Clearance: 79.7 mL/min (A) (by C-G formula based on SCr of 1.42 mg/dL (H)). Liver Function Tests: Recent Labs  Lab 01/20/20 0754 01/21/20 0308  AST 24 55*  ALT 21 21  ALKPHOS 52 40  BILITOT 0.8 1.0  PROT 7.4 6.5  ALBUMIN 4.2 3.6   No results for input(s): LIPASE, AMYLASE in the last 168 hours. No results for input(s): AMMONIA in the last 168 hours. Coagulation Profile: Recent Labs  Lab 01/20/20 1129  INR 0.9   Cardiac Enzymes: No results for input(s): CKTOTAL, CKMB, CKMBINDEX, TROPONINI in the last 168 hours. BNP (last 3 results) No results for input(s): PROBNP in the last  8760 hours. HbA1C: No results for input(s): HGBA1C in the last 72 hours. CBG: No results for input(s): GLUCAP in the last 168 hours. Lipid Profile: Recent Labs    01/21/20 0308  CHOL 251*  HDL 50  LDLCALC 181*  TRIG 99  CHOLHDL 5.0   Thyroid Function Tests: No results for input(s): TSH, T4TOTAL, FREET4, T3FREE, THYROIDAB in the last 72 hours. Anemia Panel: No results for input(s): VITAMINB12, FOLATE, FERRITIN, TIBC, IRON, RETICCTPCT in the last 72 hours. Sepsis Labs: Recent Labs  Lab 01/21/20 0308 01/21/20 0548  LATICACIDVEN 1.4 1.2    Recent Results (from the past 240 hour(s))  SARS CORONAVIRUS 2 (TAT 6-24 HRS) Nasopharyngeal Nasopharyngeal Swab     Status: None   Collection Time: 01/20/20 10:00 AM   Specimen: Nasopharyngeal Swab  Result Value Ref Range Status   SARS Coronavirus 2  NEGATIVE NEGATIVE Final    Comment: (NOTE) SARS-CoV-2 target nucleic acids are NOT DETECTED. The SARS-CoV-2 RNA is generally detectable in upper and lower respiratory specimens during the acute phase of infection. Negative results do not preclude SARS-CoV-2 infection, do not rule out co-infections with other pathogens, and should not be used as the sole basis for treatment or other patient management decisions. Negative results must be combined with clinical observations, patient history, and epidemiological information. The expected result is Negative. Fact Sheet for Patients: HairSlick.no Fact Sheet for Healthcare Providers: quierodirigir.com This test is not yet approved or cleared by the [place] FDA and  has been authorized for detection and/or diagnosis of SARS-CoV-2 by FDA under an Emergency Use Authorization (EUA). This EUA will remain  in effect (meaning this test can be used) for the duration of the COVID-19 declaration under Section 56 4(b)(1) of the Act, 21 U.S.C. section 360bbb-3(b)(1), unless the authorization is terminated or revoked sooner. Performed at [facility], [address], [place], [place] [address]   MRSA PCR Screening     Status: None   Collection Time: 01/20/20  7:39 PM   Specimen: Nasopharyngeal  Result Value Ref Range Status   MRSA by PCR NEGATIVE NEGATIVE Final    Comment:        The GeneXpert MRSA Assay (FDA approved for NASAL specimens only), is one component of a comprehensive MRSA colonization surveillance program. It is not intended to diagnose MRSA infection nor to guide or monitor treatment for MRSA infections. Performed at [facility], [address], [place], [place] [address]          Radiology Studies: CARDIAC CATHETERIZATION  Result Date: 01/23/2020  Prox LAD lesion is 30% stenosed.  Prox RCA lesion is 50% stenosed.  Dist RCA lesion is 45% stenosed.   RPAV lesion is 100% stenosed.  LV end diastolic pressure is mildly elevated.  Hemodynamic findings consistent with mild pulmonary hypertension.  1. 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 The LAD wraps around the apex and supplies the mid to distal inferior wall. Diffuse nonobstructive disease in other vessels 2. Elevated LV filling pressures. EDP 24 mm Hg. PCWP 19 mm Hg 3. Mild pulmonary HTN. 4. Normal cardiac output Plan: medical management   VAS US RENAL ARTERY DUPLEX  Result Date: 01/23/2020 ABDOMINAL VISCERAL Indications: HTN Comparison Study: No prior exam. Performing Technologist: Kennedy Bucker ARDMS, RVT  Examination Guidelines: A complete evaluation includes B-mode imaging, spectral Doppler, color Doppler, and power Doppler as needed of all accessible portions of each vessel. Bilateral testing is considered an integral part of  a complete examination. Limited examinations for reoccurring indications may be performed as noted.  Duplex Findings: +----------------------+--------+--------+------+--------+ Mesenteric            PSV cm/sEDV cm/sPlaqueComments +----------------------+--------+--------+------+--------+ Aorta Mid               115      20                  +----------------------+--------+--------+------+--------+ Celiac Artery Proximal  114      21                  +----------------------+--------+--------+------+--------+ SMA Proximal            257      22                  +----------------------+--------+--------+------+--------+    +------------------+--------+--------+-------+ Right Renal ArteryPSV cm/sEDV cm/sComment +------------------+--------+--------+-------+ Origin               33      14           +------------------+--------+--------+-------+ Proximal             33      12           +------------------+--------+--------+-------+ Mid                  29      11            +------------------+--------+--------+-------+ Distal               30      13           +------------------+--------+--------+-------+ +-----------------+--------+--------+-------+ Left Renal ArteryPSV cm/sEDV cm/sComment +-----------------+--------+--------+-------+ Origin              72      23           +-----------------+--------+--------+-------+ Proximal            51      18           +-----------------+--------+--------+-------+ Mid                 48      12           +-----------------+--------+--------+-------+ Distal              27      11           +-----------------+--------+--------+-------+ +------------+--------+--------+----+-----------+--------+--------+----+ Right KidneyPSV cm/sEDV cm/sRI  Left KidneyPSV cm/sEDV cm/sRI   +------------+--------+--------+----+-----------+--------+--------+----+ Upper Pole  17      8       0.53Upper Pole 21      11      0.50 +------------+--------+--------+----+-----------+--------+--------+----+ Mid         26      10      0.        19      7       0.64 +------------+--------+--------+----+-----------+--------+--------+----+ Lower Pole  17      9       0.50Lower Pole 37      14      0.61 +------------+--------+--------+----+-----------+--------+--------+----+ Hilar       23      8       0.65Hilar      21      9       0.57 +------------+--------+--------+----+-----------+--------+--------+----+ +------------------+-----+------------------+-----+ Right Kidney           Left Kidney             +------------------+-----+------------------+-----+  RAR                    RAR                     +------------------+-----+------------------+-----+ RAR (manual)      0.29 RAR (manual)      0.62  +------------------+-----+------------------+-----+ Cortex                 Cortex                  +------------------+-----+------------------+-----+ Cortex thickness       Corex  thickness         +------------------+-----+------------------+-----+ Kidney length (cm)10.60Kidney length (cm)10.50 +------------------+-----+------------------+-----+   Summary: Renal:  Right: Normal size right kidney. Normal right Resisitive Index. No        evidence of right renal artery stenosis. RRV flow present. Left:  Normal size of left kidney. Normal left Resistive Index. No        evidence of left renal artery stenosis. LRV flow present. Mesenteric: Normal Celiac artery and Superior Mesenteric artery findings.  *See table(s) above for measurements and observations.     Preliminary     Scheduled Meds: . aspirin EC  81 mg Oral Daily  . atorvastatin  80 mg Oral q1800  . carvedilol  25 mg Oral BID WC  . Chlorhexidine Gluconate Cloth  6 each Topical Daily  . clopidogrel  75 mg Oral Daily  . [START ON 01/24/2020] enoxaparin (LOVENOX) injection  40 mg Subcutaneous Q24H  . [START ON 01/24/2020] influenza vac split quadrivalent PF  0.5 mL Intramuscular Tomorrow-1000  . sacubitril-valsartan  1 tablet Oral BID  . sodium chloride flush  3 mL Intravenous Q12H  . spironolactone  12.5 mg Oral Daily   Continuous Infusions: . sodium chloride       LOS: 3 days    Time spent: 60min  Marelyn Rouser C Gerhart Ruggieri, DO [facility]  If 7PM-7AM, please contact night-coverage [email or web address]  01/23/2020, 5:28 PM

## 2020-01-23 NOTE — H&P (View-Only) (Signed)
Cardiology Progress Note  Patient ID: Jose Sharp MRN: [id number] DOB: April 28, 1986 Date of Encounter: 01/23/2020  Primary Cardiologist: No primary care provider on file.  Subjective  Blood pressure well controlled.  Denies chest pain or shortness of breath.  Renal artery duplex today.  Left heart cath today.  ROS:  All other ROS reviewed and negative. Pertinent positives noted in the HPI.     Inpatient Medications  Scheduled Meds: . aspirin EC  81 mg Oral Daily  . atorvastatin  80 mg Oral q1800  . carvedilol  25 mg Oral BID WC  . Chlorhexidine Gluconate Cloth  6 each Topical Daily  . [START ON 01/24/2020] influenza vac split quadrivalent PF  0.5 mL Intramuscular Tomorrow-1000  . isosorbide-hydrALAZINE  2 tablet Oral TID   Continuous Infusions: . heparin 1,150 Units/hr (01/23/20 0535)   PRN Meds: ondansetron **OR** ondansetron (ZOFRAN) IV   Vital Signs   Vitals:   01/22/20 1946 01/23/20 0032 01/23/20 0453 01/23/20 0824  BP: (!) 141/82 (!) 147/86 (!) 142/79 140/85  Pulse: 82 81 78 78  Resp: 16 17 18 19   Temp: 98.4 F (36.9 C) 99.3 F (37.4 C) 98.7 F (37.1 C) 98.8 F (37.1 C)  TempSrc: Oral Oral Oral Oral  SpO2: 98% 98% 97% 99%  Weight:   91 kg   Height:        Intake/Output Summary (Last 24 hours) at 01/23/2020 0918 Last data filed at 01/23/2020 0500 Gross per 24 hour  Intake 721.27 ml  Output 700 ml  Net 21.27 ml   Last 3 Weights 01/23/2020 01/22/2020 01/20/2020  Weight (lbs) 200 lb 9.6 oz 202 lb 199 lb  Weight (kg) 90.992 kg 91.627 kg 90.266 kg      Telemetry  Overnight telemetry shows normal sinus rhythm with heart rate 60-70, which I personally reviewed.   ECG  The most recent ECG shows normal sinus rhythm heart rate 66, diffuse anterolateral T wave inversions, which I personally reviewed.   Physical Exam   Vitals:   01/22/20 1946 01/23/20 0032 01/23/20 0453 01/23/20 0824  BP: (!) 141/82 (!) 147/86 (!) 142/79 140/85  Pulse: 82 81 78 78  Resp:  16 17 18 19   Temp: 98.4 F (36.9 C) 99.3 F (37.4 C) 98.7 F (37.1 C) 98.8 F (37.1 C)  TempSrc: Oral Oral Oral Oral  SpO2: 98% 98% 97% 99%  Weight:   91 kg   Height:         Intake/Output Summary (Last 24 hours) at 01/23/2020 0918 Last data filed at 01/23/2020 0500 Gross per 24 hour  Intake 721.27 ml  Output 700 ml  Net 21.27 ml    Last 3 Weights 01/23/2020 01/22/2020 01/20/2020  Weight (lbs) 200 lb 9.6 oz 202 lb 199 lb  Weight (kg) 90.992 kg 91.627 kg 90.266 kg    Body mass index is 27.21 kg/m.  General: Well nourished, well developed, in no acute distress Head: Atraumatic, normal size  Eyes: PEERLA, EOMI  Neck: Supple, no JVD Endocrine: No thryomegaly Cardiac: Normal S1, S2; RRR; no murmurs, rubs, or gallops Lungs: Clear to auscultation bilaterally, no wheezing, rhonchi or rales  Abd: Soft, nontender, no hepatomegaly  Ext: No edema, pulses 2+ Musculoskeletal: No deformities, BUE and BLE strength normal and equal Skin: Warm and dry, no rashes   Neuro: Alert and oriented to person, place, time, and situation, CNII-XII grossly intact, no focal deficits  Psych: Normal mood and affect   Labs  High Sensitivity Troponin:   Recent  Labs  Lab 01/20/20 1011 01/20/20 1129 01/20/20 1258 01/21/20 0548 01/22/20 0552  TROPONINIHS 260* 347* 535* 6,378* 6,397*     Cardiac EnzymesNo results for input(s): TROPONINI in the last 168 hours. No results for input(s): TROPIPOC in the last 168 hours.  Chemistry Recent Labs  Lab 01/20/20 0754 01/20/20 0754 01/21/20 0308 01/22/20 0125 01/23/20 0427  NA 136   < > 135 136 138  K 3.8   < > 2.9* 3.3* 3.5  CL 103   < > 100 102 105  CO2 23   < > 24 23 23  GLUCOSE 128*   < > 110* 102* 102*  BUN 21*   < > 17 17 13  CREATININE 1.44*   < > 1.89* 1.85* 1.42*  CALCIUM 9.0   < > 9.0 8.9 9.2  PROT 7.4  --  6.5  --   --   ALBUMIN 4.2  --  3.6  --   --   AST 24  --  55*  --   --   ALT 21  --  21  --   --   ALKPHOS 52  --  40  --   --     BILITOT 0.8  --  1.0  --   --   GFRNONAA >60   < > 45* 46* >60  GFRAA >60   < > 52* 53* >60  ANIONGAP 10   < > 11 11 10   < > = values in this interval not displayed.    Hematology Recent Labs  Lab 01/21/20 0308 01/22/20 0125 01/23/20 0427  WBC 7.5 7.7 7.8  RBC 4.01* 3.84* 3.88*  HGB 11.7* 11.0* 11.2*  HCT 34.3* 33.0* 33.8*  MCV 85.5 85.9 87.1  MCH 29.2 28.6 28.9  MCHC 34.1 33.3 33.1  RDW 12.8 13.2 13.1  PLT 212 201 220   BNP Recent Labs  Lab 01/20/20 0754  BNP 216.7*    DDimer No results for input(s): DDIMER in the last 168 hours.   Radiology  No results found.  Cardiac Studies  TTE 01/20/2020 1. Left ventricular ejection fraction, by estimation, is 25 to 30%. The  left ventricle has severely decreased function. The left ventricle  demonstrates global hypokinesis. There is moderate concentric left  ventricular hypertrophy. Left ventricular  diastolic parameters are consistent with Grade III diastolic dysfunction  (restrictive). Elevated left atrial pressure.  2. Right ventricular systolic function is low normal. The right  ventricular size is normal. There is mildly elevated pulmonary artery  systolic pressure. The estimated right ventricular systolic pressure is  37.8 mmHg.  3. Left atrial size was mildly dilated.  4. The mitral valve is grossly normal. Trivial mitral valve  regurgitation. No evidence of mitral stenosis.  5. The aortic valve is tricuspid. Aortic valve regurgitation is not  visualized. No aortic stenosis is present.  6. The inferior vena cava is normal in size with <50% respiratory  variability, suggesting right atrial pressure of 8 mmHg.   Patient Profile  Jose Sharp is a 33 y.o. male with history of hypertension who was admitted with hypertensive emergency with chest pain on 01/20/2020.  Echocardiogram shows low EF.  Troponins up to 6300.  Assessment & Plan  1.  Hypertensive emergency -Blood pressure is stable on Coreg and  BiDil.   -Ejection fraction 25-30% with global hypokinesis. -Troponin elevation up to 6300.  EKG with diffuse anterolateral T inversions.  Unclear if this is demand ischemia versus actual non-STEMI.    No chest pain. -Blood pressure well controlled today.  Continue Coreg and BiDil.  I likely will transition him to an ACE/ARB/Arni tomorrow. -Secondary work-up: UDS negative for cocaine.  Is positive for THC.  Plasma nephro is pending, Reinaldo level pending.  Renal artery duplex today. Thyroid studies normal  2. Elevated troponin, non-STEMI versus demand ischemia -Troponins have increased up to 6378.  EKG with pronounced anterior lateral T wave changes.  There is no ST elevation.  His EKG likely just represents profound LVH.  He has no apical variant hypertrophic cardiomyopathy on his echocardiogram.  His EF is 25-30% with global hypokinesis and no regional wall motion normalities. -Likely secondary to hypertensive crisis. -We will exclude effective CAD tomorrow with left heart catheterization.  N.p.o. midnight. -Continue aspirin, high intensity statin, heparin drip for now.  3.  New onset systolic heart failure, ejection fraction 25-30% -Likely secondary to hypertension.  Discussion above regarding elevated troponin and need for left heart catheterization prior to discharge. -Normal thyroid studies.  Secondary work-up for hypertension as above. -Left heart catheterization tomorrow to rule out obstructive CAD -Euvolemic on examination.  No need for Lasix. -Continue Coreg and BiDil as above.  We will add ACE/ARB/Arni plus minus Aldactone after LHC.  4. HLD -Most recent LDL 181.  I have increased his Lipitor to 80 mg daily.  For questions or updates, please contact [facility] Please consult [email or web address] for contact info under   Time Spent with Patient: I have spent a total of 25 minutes with patient reviewing hospital notes, telemetry, EKGs, labs and examining the patient as well as  establishing an assessment and plan that was discussed with the patient.  > 50% of time was spent in direct patient care.    Signed, Lenna Gilford. Flora Lipps, MD [facility]  01/23/2020 9:18 AM

## 2020-01-23 NOTE — Progress Notes (Signed)
ANTICOAGULATION CONSULT NOTE   Pharmacy Consult for heparin Indication: chest pain/ACS  No Known Allergies  Patient Measurements: Height: 6' (182.9 cm) Weight: 200 lb 9.6 oz (91 kg) IBW/kg (Calculated) : 77.6 Heparin Dosing Weight: 90.3kg  Vital Signs: Temp: 98.7 F (37.1 C) (03/29 0453) Temp Source: Oral (03/29 0453) BP: 142/79 (03/29 0453) Pulse Rate: 78 (03/29 0453)  Labs: Recent Labs    01/20/20 1129 01/20/20 1129 01/20/20 1258 01/20/20 2012 01/21/20 0308 01/21/20 0308 01/21/20 0548 01/21/20 1128 01/22/20 0125 01/22/20 0552 01/23/20 0427  HGB  --    < >  --   --  11.7*   < >  --   --  11.0*  --  11.2*  HCT  --    < >  --   --  34.3*  --   --   --  33.0*  --  33.8*  PLT  --    < >  --   --  212  --   --   --  201  --  220  APTT 20*  --   --   --   --   --   --   --   --   --   --   LABPROT 12.1  --   --   --   --   --   --   --   --   --   --   INR 0.9  --   --   --   --   --   --   --   --   --   --   HEPARINUNFRC  --   --   --    < >  --   --   --  0.39 0.28*  --  0.38  CREATININE  --    < >  --   --  1.89*  --   --   --  1.85*  --  1.42*  TROPONINIHS 347*   < > 535*  --   --   --  6,378*  --   --  6,397*  --    < > = values in this interval not displayed.    Estimated Creatinine Clearance: 79.7 mL/min (A) (by C-G formula based on SCr of 1.42 mg/dL (H)).   Medical History: Past Medical History:  Diagnosis Date  . Hypertension      Assessment: 34 y.o. male with medical history significant for hypertension who presents to the ER for evaluation of chest pain.  Pharmacy consulted to dose heparin.  No prior AC noted.   Heparin level low this AM is therapeutic at 0.38 on 1150 units/hr. H/H low stable, Plt wnl. SCr improved to 1.42. LHC planned for today. RN reports no s/s of overt bleeding   Goal of Therapy:  Heparin level 0.3-0.7 units/ml Monitor platelets by anticoagulation protocol   Plan:  -Continue IV heparin at 1150 unit/hr -Daily heparin level  and CBC -F/u anticoagulation plans after cath   Vinnie Level, PharmD., BCPS Clinical Pharmacist Clinical phone for 01/23/20 until 3:30pm: [phone] If after 3:30pm, please refer to [facility] for unit-specific pharmacist

## 2020-01-23 NOTE — Progress Notes (Signed)
Heparin stopped patient going for catheteriztion

## 2020-01-23 NOTE — Interval H&P Note (Signed)
History and Physical Interval Note:  01/23/2020 3:03 PM  Jose Sharp  has presented today for surgery, with the diagnosis of NSTEMI.  The various methods of treatment have been discussed with the patient and family. After consideration of risks, benefits and other options for treatment, the patient has consented to  Procedure(s): RIGHT/LEFT HEART CATH AND CORONARY ANGIOGRAPHY (N/A) as a surgical intervention.  The patient's history has been reviewed, patient examined, no change in status, stable for surgery.  I have reviewed the patient's chart and labs.  Questions were answered to the patient's satisfaction.   Cath Lab Visit (complete for each Cath Lab visit)  Clinical Evaluation Leading to the Procedure:   ACS: Yes.    Non-ACS:    Anginal Classification: CCS III  Anti-ischemic medical therapy: Maximal Therapy (2 or more classes of medications)  Non-Invasive Test Results: No non-invasive testing performed  Prior CABG: No previous CABG        Theron Arista [facility] 01/23/2020 3:04 PM

## 2020-01-24 LAB — BASIC METABOLIC PANEL WITH GFR
Anion gap: 13 (ref 5–15)
BUN: 13 mg/dL (ref 6–20)
CO2: 21 mmol/L — ABNORMAL LOW (ref 22–32)
Calcium: 9.4 mg/dL (ref 8.9–10.3)
Chloride: 103 mmol/L (ref 98–111)
Creatinine, Ser: 1.54 mg/dL — ABNORMAL HIGH (ref 0.61–1.24)
GFR calc Af Amer: >60 mL/min
GFR calc non Af Amer: 58 mL/min — ABNORMAL LOW
Glucose, Bld: 105 mg/dL — ABNORMAL HIGH (ref 70–99)
Potassium: 3.8 mmol/L (ref 3.5–5.1)
Sodium: 137 mmol/L (ref 135–145)

## 2020-01-24 LAB — CBC
HCT: 38.5 % — ABNORMAL LOW (ref 39.0–52.0)
Hemoglobin: 13 g/dL (ref 13.0–17.0)
MCH: 29.4 pg (ref 26.0–34.0)
MCHC: 33.8 g/dL (ref 30.0–36.0)
MCV: 87.1 fL (ref 80.0–100.0)
Platelets: 234 10*3/uL (ref 150–400)
RBC: 4.42 MIL/uL (ref 4.22–5.81)
RDW: 13 % (ref 11.5–15.5)
WBC: 7.5 10*3/uL (ref 4.0–10.5)
nRBC: 0 % (ref 0.0–0.2)

## 2020-01-24 LAB — MAGNESIUM: Magnesium: 2 mg/dL (ref 1.7–2.4)

## 2020-01-24 LAB — PHOSPHORUS: Phosphorus: 3.5 mg/dL (ref 2.5–4.6)

## 2020-01-24 MED ORDER — SPIRONOLACTONE 25 MG PO TABS: 25.0000 mg | ORAL_TABLET | Freq: Every day | ORAL | 1 refills | Status: AC

## 2020-01-24 MED ORDER — ATORVASTATIN CALCIUM 80 MG PO TABS: 80.0000 mg | ORAL_TABLET | Freq: Every day | ORAL | 1 refills | Status: DC

## 2020-01-24 MED ORDER — CLOPIDOGREL BISULFATE 75 MG PO TABS: 75.0000 mg | ORAL_TABLET | Freq: Every day | ORAL | 1 refills | Status: DC

## 2020-01-24 MED ORDER — SACUBITRIL-VALSARTAN 97-103 MG PO TABS
1.0000 | ORAL_TABLET | Freq: Two times a day (BID) | ORAL | Status: DC
  Administered 2020-01-24: 1 via ORAL
  Filled 2020-01-24 (×2): qty 1

## 2020-01-24 MED ORDER — SACUBITRIL-VALSARTAN 97-103 MG PO TABS: 1.0000 | ORAL_TABLET | Freq: Two times a day (BID) | ORAL | 1 refills | Status: DC

## 2020-01-24 MED ORDER — ASPIRIN 81 MG PO TBEC: 81.0000 mg | DELAYED_RELEASE_TABLET | Freq: Every day | ORAL | 1 refills | Status: AC

## 2020-01-24 MED ORDER — ATORVASTATIN CALCIUM 80 MG PO TABS: 80.0000 mg | ORAL_TABLET | Freq: Every day | ORAL | 1 refills | Status: AC

## 2020-01-24 MED ORDER — CLOPIDOGREL BISULFATE 75 MG PO TABS: 75.0000 mg | ORAL_TABLET | Freq: Every day | ORAL | 1 refills | Status: AC

## 2020-01-24 MED ORDER — SACUBITRIL-VALSARTAN 97-103 MG PO TABS: 1.0000 | ORAL_TABLET | Freq: Two times a day (BID) | ORAL | 1 refills | Status: AC

## 2020-01-24 MED ORDER — SPIRONOLACTONE 25 MG PO TABS: 25.0000 mg | ORAL_TABLET | Freq: Every day | ORAL | 1 refills | Status: DC

## 2020-01-24 MED ORDER — CARVEDILOL 25 MG PO TABS: 25.0000 mg | ORAL_TABLET | Freq: Two times a day (BID) | ORAL | 1 refills | Status: DC

## 2020-01-24 MED ORDER — SPIRONOLACTONE 25 MG PO TABS
25.0000 mg | ORAL_TABLET | Freq: Every day | ORAL | Status: DC
  Administered 2020-01-24: 25 mg via ORAL
  Filled 2020-01-24: qty 1

## 2020-01-24 MED ORDER — ASPIRIN 81 MG PO TBEC: 81.0000 mg | DELAYED_RELEASE_TABLET | Freq: Every day | ORAL | 1 refills | Status: DC

## 2020-01-24 MED ORDER — CARVEDILOL 25 MG PO TABS: 25.0000 mg | ORAL_TABLET | Freq: Two times a day (BID) | ORAL | 1 refills | Status: AC

## 2020-01-24 MED FILL — ASPIRIN LOW DOSE 81 MG TBEC: 81 | 30 days supply | Qty: 30 | Fill #0

## 2020-01-24 MED FILL — ENTRESTO 97 MG-103 MG TAB: 97-103 | 30 days supply | Qty: 60 | Fill #0

## 2020-01-24 MED FILL — SPIRONOLACTONE 25 MG TABLET: 25 | 30 days supply | Qty: 30 | Fill #0

## 2020-01-24 MED FILL — CARVEDILOL 25 MG TABLET: 25 | 30 days supply | Qty: 60 | Fill #0

## 2020-01-24 MED FILL — ATORVASTATIN CALCIUM 80 MG: 80 | 30 days supply | Qty: 30 | Fill #0

## 2020-01-24 MED FILL — CLOPIDOGREL 75 MG TABLET: 75 | 30 days supply | Qty: 30 | Fill #0

## 2020-01-24 NOTE — TOC Benefit Eligibility Note (Signed)
Transition of Care [facility]) Benefit Eligibility Note    Patient Details  Name: Jose Sharp MRN: [id number] Date of Birth: 1986-03-03   Medication/Dose: Sherryll Burger  97-103 MG BID  Covered?: Yes  Tier: (NO TIER)  Prescription Coverage Preferred Pharmacy: [facility] with Person/Company/Phone Number:: LYNN    @  [employer] # [phone]  Co-Pay: $3.00  Prior Approval: Yes(# [phone])          Mardene Sayer Phone Number: 01/24/2020, 3:08 PM

## 2020-01-24 NOTE — Progress Notes (Signed)
Patient alert and oriented, denies pain, BP is still elevated MD okay to d/c patient. D/c instruction explain and given to the patient, all questions answered. Patient d/c per order

## 2020-01-24 NOTE — Progress Notes (Signed)
Cardiology Progress Note  Patient ID: Jose Sharp MRN: [id number] DOB: 08/12/1986 Date of Encounter: 01/24/2020  Primary Cardiologist: No primary care provider on file.  Subjective  No complaints this morning.  Cath site looks good.  Blood pressure bit elevated but now getting BP meds.  ROS:  All other ROS reviewed and negative. Pertinent positives noted in the HPI.     Inpatient Medications  Scheduled Meds: . aspirin EC  81 mg Oral Daily  . atorvastatin  80 mg Oral q1800  . carvedilol  25 mg Oral BID WC  . Chlorhexidine Gluconate Cloth  6 each Topical Daily  . clopidogrel  75 mg Oral Daily  . enoxaparin (LOVENOX) injection  40 mg Subcutaneous Q24H  . influenza vac split quadrivalent PF  0.5 mL Intramuscular Tomorrow-1000  . sacubitril-valsartan  1 tablet Oral BID  . sodium chloride flush  3 mL Intravenous Q12H  . spironolactone  25 mg Oral Daily   Continuous Infusions: . sodium chloride     PRN Meds: sodium chloride, acetaminophen, ondansetron **OR** ondansetron (ZOFRAN) IV, sodium chloride flush   Vital Signs   Vitals:   01/24/20 0758 01/24/20 0858 01/24/20 1000 01/24/20 1007  BP: (!) 167/110 (!) 166/105 (!) 156/112 (!) 156/112  Pulse:  73    Resp:      Temp:      TempSrc:      SpO2:      Weight:      Height:        Intake/Output Summary (Last 24 hours) at 01/24/2020 1055 Last data filed at 01/24/2020 0841 Gross per 24 hour  Intake 1770.51 ml  Output 1100 ml  Net 670.51 ml   Last 3 Weights 01/24/2020 01/23/2020 01/22/2020  Weight (lbs) 196 lb 14.4 oz 200 lb 9.6 oz 202 lb  Weight (kg) 89.313 kg 90.992 kg 91.627 kg      Telemetry  Overnight telemetry shows normal sinus rhythm with heart rate 60-70, which I personally reviewed.   ECG  The most recent ECG shows normal sinus rhythm heart rate 66, diffuse anterolateral T wave inversions, which I personally reviewed.   Physical Exam   Vitals:   01/24/20 0758 01/24/20 0858 01/24/20 1000 01/24/20 1007   BP: (!) 167/110 (!) 166/105 (!) 156/112 (!) 156/112  Pulse:  73    Resp:      Temp:      TempSrc:      SpO2:      Weight:      Height:         Intake/Output Summary (Last 24 hours) at 01/24/2020 1055 Last data filed at 01/24/2020 0841 Gross per 24 hour  Intake 1770.51 ml  Output 1100 ml  Net 670.51 ml    Last 3 Weights 01/24/2020 01/23/2020 01/22/2020  Weight (lbs) 196 lb 14.4 oz 200 lb 9.6 oz 202 lb  Weight (kg) 89.313 kg 90.992 kg 91.627 kg    Body mass index is 26.7 kg/m.  General: Well nourished, well developed, in no acute distress Head: Atraumatic, normal size  Eyes: PEERLA, EOMI  Neck: Supple, no JVD Endocrine: No thryomegaly Cardiac: Normal S1, S2; RRR; no murmurs, rubs, or gallops Lungs: Clear to auscultation bilaterally, no wheezing, rhonchi or rales  Abd: Soft, nontender, no hepatomegaly  Ext: Right radial cath site clean dry without evidence of hematoma Musculoskeletal: No deformities, BUE and BLE strength normal and equal Skin: Warm and dry, no rashes   Neuro: Alert and oriented to person, place, time, and situation, CNII-XII  grossly intact, no focal deficits  Psych: Normal mood and affect   Labs  High Sensitivity Troponin:   Recent Labs  Lab 01/20/20 1011 01/20/20 1129 01/20/20 1258 01/21/20 0548 01/22/20 0552  TROPONINIHS 260* 347* 535* 6,378* 6,397*     Cardiac EnzymesNo results for input(s): TROPONINI in the last 168 hours. No results for input(s): TROPIPOC in the last 168 hours.  Chemistry Recent Labs  Lab 01/20/20 0754 01/20/20 0754 01/21/20 0308 01/21/20 0308 01/22/20 0125 01/22/20 0125 01/23/20 0427 01/23/20 0427 01/23/20 1529 01/23/20 1534 01/24/20 0539  NA 136   < > 135   < > 136   < > 138   < > 140 140 137  K 3.8   < > 2.9*   < > 3.3*   < > 3.5   < > 3.4* 3.4* 3.8  CL 103   < > 100   < > 102  --  105  --   --   --  103  CO2 23   < > 24   < > 23  --  23  --   --   --  21*  GLUCOSE 128*   < > 110*   < > 102*  --  102*  --   --   --   105*  BUN 21*   < > 17   < > 17  --  13  --   --   --  13  CREATININE 1.44*   < > 1.89*   < > 1.85*  --  1.42*  --   --   --  1.54*  CALCIUM 9.0   < > 9.0   < > 8.9  --  9.2  --   --   --  9.4  PROT 7.4  --  6.5  --   --   --   --   --   --   --   --   ALBUMIN 4.2  --  3.6  --   --   --   --   --   --   --   --   AST 24  --  55*  --   --   --   --   --   --   --   --   ALT 21  --  21  --   --   --   --   --   --   --   --   ALKPHOS 52  --  40  --   --   --   --   --   --   --   --   BILITOT 0.8  --  1.0  --   --   --   --   --   --   --   --   GFRNONAA >60   < > 45*   < > 46*  --  >60  --   --   --  58*  GFRAA >60   < > 52*   < > 53*  --  >60  --   --   --  >60  ANIONGAP 10   < > 11   < > 11  --  10  --   --   --  13   < > = values in this interval not displayed.    Hematology Recent Labs  Lab 01/22/20 0125 01/22/20 0125 01/23/20 1916 01/23/20  4315 01/23/20 1529 01/23/20 1534 01/24/20 0539  WBC 7.7  --  7.8  --   --   --  7.5  RBC 3.84*  --  3.88*  --   --   --  4.42  HGB 11.0*   < > 11.2*   < > 10.2* 10.5* 13.0  HCT 33.0*   < > 33.8*   < > 30.0* 31.0* 38.5*  MCV 85.9  --  87.1  --   --   --  87.1  MCH 28.6  --  28.9  --   --   --  29.4  MCHC 33.3  --  33.1  --   --   --  33.8  RDW 13.2  --  13.1  --   --   --  13.0  PLT 201  --  220  --   --   --  234   < > = values in this interval not displayed.   BNP Recent Labs  Lab 01/20/20 0754  BNP 216.7*    DDimer No results for input(s): DDIMER in the last 168 hours.   Radiology  CARDIAC CATHETERIZATION  Result Date: 01/23/2020  Prox LAD lesion is 30% stenosed.  Prox RCA lesion is 50% stenosed.  Dist RCA lesion is 45% stenosed.  RPAV lesion is 100% stenosed.  LV end diastolic pressure is mildly elevated.  Hemodynamic findings consistent with mild pulmonary hypertension.  1. 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 The LAD wraps around the apex and supplies the mid to distal inferior wall. Diffuse  nonobstructive disease in other vessels 2. Elevated LV filling pressures. EDP 24 mm Hg. PCWP 19 mm Hg 3. Mild pulmonary HTN. 4. Normal cardiac output Plan: medical management   VAS US RENAL ARTERY DUPLEX  Result Date: 01/23/2020 ABDOMINAL VISCERAL Indications: HTN Comparison Study: No prior exam. Performing Technologist: Kennedy Bucker ARDMS, RVT  Examination Guidelines: A complete evaluation includes B-mode imaging, spectral Doppler, color Doppler, and power Doppler as needed of all accessible portions of each vessel. Bilateral testing is considered an integral part of a complete examination. Limited examinations for reoccurring indications may be performed as noted.  Duplex Findings: +----------------------+--------+--------+------+--------+ Mesenteric            PSV cm/sEDV cm/sPlaqueComments +----------------------+--------+--------+------+--------+ Aorta Mid               115      20                  +----------------------+--------+--------+------+--------+ Celiac Artery Proximal  114      21                  +----------------------+--------+--------+------+--------+ SMA Proximal            257      22                  +----------------------+--------+--------+------+--------+    +------------------+--------+--------+-------+ Right Renal ArteryPSV cm/sEDV cm/sComment +------------------+--------+--------+-------+ Origin               33      14           +------------------+--------+--------+-------+ Proximal             33      12           +------------------+--------+--------+-------+ Mid                  29  11           +------------------+--------+--------+-------+ Distal               30      13           +------------------+--------+--------+-------+ +-----------------+--------+--------+-------+ Left Renal ArteryPSV cm/sEDV cm/sComment +-----------------+--------+--------+-------+ Origin              72      23            +-----------------+--------+--------+-------+ Proximal            51      18           +-----------------+--------+--------+-------+ Mid                 48      12           +-----------------+--------+--------+-------+ Distal              27      11           +-----------------+--------+--------+-------+ +------------+--------+--------+----+-----------+--------+--------+----+ Right KidneyPSV cm/sEDV cm/sRI  Left KidneyPSV cm/sEDV cm/sRI   +------------+--------+--------+----+-----------+--------+--------+----+ Upper Pole  17      8       0.53Upper Pole 21      11      0.50 +------------+--------+--------+----+-----------+--------+--------+----+ Mid         26      10      0.        19      7       0.64 +------------+--------+--------+----+-----------+--------+--------+----+ Lower Pole  17      9       0.50Lower Pole 37      14      0.61 +------------+--------+--------+----+-----------+--------+--------+----+ Hilar       23      8       0.65Hilar      21      9       0.57 +------------+--------+--------+----+-----------+--------+--------+----+ +------------------+-----+------------------+-----+ Right Kidney           Left Kidney             +------------------+-----+------------------+-----+ RAR                    RAR                     +------------------+-----+------------------+-----+ RAR (manual)      0.29 RAR (manual)      0.62  +------------------+-----+------------------+-----+ Cortex                 Cortex                  +------------------+-----+------------------+-----+ Cortex thickness       Corex thickness         +------------------+-----+------------------+-----+ Kidney length (cm)10.60Kidney length (cm)10.50 +------------------+-----+------------------+-----+  Summary: Renal:  Right: Normal size right kidney. Normal right Resisitive Index. No        evidence of right renal artery stenosis. RRV flow  present. Left:  Normal size of left kidney. Normal left Resistive Index. No        evidence of left renal artery stenosis. LRV flow present. Mesenteric: Normal Celiac artery and Superior Mesenteric artery findings.  *See table(s) above for measurements and observations.  Diagnosing physician: Fabienne Bruns MD  Electronically signed by Fabienne Bruns MD on 01/23/2020 at 8:28:47 PM.    Final     Cardiac Studies  TTE 01/20/2020  1. Left ventricular ejection fraction, by estimation, is 25 to 30%. The  left ventricle has severely decreased function. The left ventricle  demonstrates global hypokinesis. There is moderate concentric left  ventricular hypertrophy. Left ventricular  diastolic parameters are consistent with Grade III diastolic dysfunction  (restrictive). Elevated left atrial pressure.  2. Right ventricular systolic function is low normal. The right  ventricular size is normal. There is mildly elevated pulmonary artery  systolic pressure. The estimated right ventricular systolic pressure is  37.8 mmHg.  3. Left atrial size was mildly dilated.  4. The mitral valve is grossly normal. Trivial mitral valve  regurgitation. No evidence of mitral stenosis.  5. The aortic valve is tricuspid. Aortic valve regurgitation is not  visualized. No aortic stenosis is present.  6. The inferior vena cava is normal in size with <50% respiratory  variability, suggesting right atrial pressure of 8 mmHg.   LHC   Prox LAD lesion is 30% stenosed.  Prox RCA lesion is 50% stenosed.  Dist RCA lesion is 45% stenosed.  RPAV lesion is 100% stenosed.  LV end diastolic pressure is mildly elevated.  Hemodynamic findings consistent with mild pulmonary hypertension.   1. 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 The LAD wraps around the apex and supplies the mid to distal inferior wall. Diffuse nonobstructive disease in other vessels 2. Elevated LV filling pressures. EDP 24 mm Hg. PCWP  19 mm Hg 3. Mild pulmonary HTN.  4. Normal cardiac output  Patient Profile  Jarryn Altland is a 34 y.o. male with history of hypertension who was admitted with hypertensive emergency with chest pain on 01/20/2020.  Echocardiogram shows low EF.  Troponins up to 6300.  Assessment & Plan  1.  Hypertensive emergency -Ejection fraction 25-30% with global hypokinesis. -Troponin elevation up to 6300.  EKG with diffuse anterolateral T inversions.   -Left heart cath with 100% occluded right posterior lateral branch this was the likely etiology of his troponin elevation. -Secondary work-up: UDS negative for cocaine.  Is positive for THC.  Plasma nephro is pending, Reinaldo level pending.  Renal artery duplex today. Thyroid studies normal  2.  Non-STEMI -Left heart cath with 100% occluded right PLV branch  -Received 48 hours of heparin.  This lesion not amenable to stenting. -I favor aspirin Plavix for 1 year. -He will continue high intensity statin.  Aggressive lipid-lowering therapy needed.  3.  New onset systolic heart failure, ejection fraction 25-30% -Likely secondary to hypertension.  -Left ventricular systolic dysfunction out of proportion to CAD.  This is likely nonischemic cardiomyopathy. -Normal thyroid studies.  Secondary work-up for hypertension as above. -Plan for discharge on Coreg 25 mg twice daily, Entresto 97-103 twice daily, Aldactone 25 mg daily  4. HLD -Most recent LDL 181.  I have increased his Lipitor to 80 mg daily.  For questions or updates, please contact [facility] Please consult [email or web address] for contact info under   [facility] will sign off.   Medication Recommendations: Okay for discharge today.  We will check a BMP next week we will see him in clinic.  Carvedilol 25 mg twice daily, Entresto 97-103 milligrams twice daily, Aldactone 25 mg daily, aspirin 81 mg daily, Plavix 75 mg daily, Lipitor 80 mg daily Other recommendations (labs, testing, etc): We will  follow-up the results of his Reinaldo and plasma metanephrines as outpatient Follow up as an outpatient: We will arrange a 1 week follow-up in our APP clinic.  He will then see  me in 2 to 4 weeks in my clinic.  Time Spent with Patient: I have spent a total of 35 minutes with patient reviewing hospital notes, telemetry, EKGs, labs and examining the patient as well as establishing an assessment and plan that was discussed with the patient.  > 50% of time was spent in direct patient care.    Signed, Addison Naegeli. Audie Box, [place]  01/24/2020 10:55 AM

## 2020-01-24 NOTE — TOC Transition Note (Addendum)
Transition of Care [facility]) - CM/SW Discharge Note   Patient Details  Name: Jose Sharp MRN: [id number] Date of Birth: 09-07-1986  Transition of Care [facility] CM/SW Contact:  Leone Haven, RN Phone Number: 01/24/2020, 12:18 PM   Clinical Narrative:    Patient is for dc today, TOC pharmacy will be filling medications for him, including the 30 day free entresto for him.  Patient states he has 27.00 that he can pay.  Patient is thinking about moving to [place] , NCM informed him he will need to speak with his Medicaid Case worker to get his Medicaid changed to [place].  He will call [facility] to schedule his follow up apt.   Final next level of care: Home/Self Care Barriers to Discharge: No Barriers Identified   Patient Goals and CMS Choice Patient states their goals for this hospitalization and ongoing recovery are:: get better   Choice offered to / list presented to : NA  Discharge Placement                       Discharge Plan and Services                  DME Agency: NA       HH Arranged: NA          Social Determinants of Health (SDOH) Interventions     Readmission Risk Interventions No flowsheet data found.

## 2020-01-24 NOTE — Discharge Summary (Signed)
Physician Discharge Summary  Jose Sharp ZOX:[id number] DOB: [birth year] DOA: 01/20/2020  PCP: System, Pcp Not In  Admit date: 01/20/2020 Discharge date: 01/24/2020  Admitted From: Home Disposition: Home  Recommendations for Outpatient Follow-up:  1. Follow up with PCP in 1-2 weeks 2. Please obtain BMP/CBC in one week  Discharge Condition: Stable CODE STATUS: Full Diet recommendation: Cardiac diet as tolerated  Brief/Interim Summary: 34 year old male with a history significant for hypertension, woke up early hours of January 20, 2020 due to chest pain and indigestion. There was associated shortness of breath. Symptoms got worse with lying down. There is no headache or dizziness or lightheadedness abdominal pain or urinary symptoms or stroke symptoms. In the emergency department his systolic blood pressure was 251/179. Troponin was slightly elevated. Labetalol infusion was started and the systolic blood pressure was ranging anywhere from 140-160. His creatinine was 1.44 mg percent. [No priors] patient was evaluated by cardiology and they noticed that with labetalol his symptoms had improved. Initial cardiac evaluation showed left arm and right arm systolic blood pressures to be equal but at the time of CCM evaluation on the right arm it was 145 systolic with a MAP of 125 but is in the left arm it was 165 systolic with a slightly more elevated map. Patient is at [facility] long emergency department but needs to be admitted to cardiac ICU at [facility]. CCM MD admitting and taking over from [facility] hospitalist. Regarding risk factors patient drinks alcohol 4 days/week in varying forms. Uses marijuana but denies other illicit drugs. There is concern for sleep apnea because he snores according to cardiology notes. But has not been tested for sleep apnea. History of non-compliant with bp meds in past. Echo shows Systolic dysfunction - ef 25% with grade 3 diastolic dysfunction as  well.  Patient evaluated as above with cardiology, echo shows acute systolic heart failure dysfunction 25 to 30% with grade 3 diastolic dysfunction in the setting of hypertensive emergency and elevated troponin.  Patient underwent cardiac catheterization showing multiple vessel disease but cardiology recommended medical management given no high-grade stenosis.  At this time patient's blood pressure is moderately well controlled, cardiology initiated patient on new medications including Entresto, carvedilol, spironolactone, aspirin, Plavix, statin.  He discussion at bedside today about need for medication dietary and lifestyle compliance given patient's young age and remarkably elevated blood pressure.  Patient underwent evaluation for secondary causes of hypertension including renal artery stenosis and thyroid evaluation which were negative.  Patient does have renin/aldosterone levels pending which will be followed in the outpatient setting by cardiology.  Patient was in cardiology in 1 week then again in 2 to 4 weeks.  Patient will need close follow-up with PCP as well but does not have one in the area, we have recommended residency clinic at this time which patient is agreeable to see.Marland Kitchen   Discharge Diagnoses:  Principal Problem:   Chest pain at rest Active Problems:   Hypertensive emergency   Elevated troponin    Discharge Instructions  Discharge Instructions    (HEART FAILURE PATIENTS) Call MD:  Anytime you have any of the following symptoms: 1) 3 pound weight gain in 24 hours or 5 pounds in 1 week 2) shortness of breath, with or without a dry hacking cough 3) swelling in the hands, feet or stomach 4) if you have to sleep on extra pillows at night in order to breathe.   Complete by: As directed    Call MD for:  difficulty breathing, headache  or visual disturbances   Complete by: As directed    Call MD for:  extreme fatigue   Complete by: As directed    Call MD for:  hives   Complete by: As  directed    Call MD for:  persistant dizziness or light-headedness   Complete by: As directed    Call MD for:  persistant nausea and vomiting   Complete by: As directed    Call MD for:  redness, tenderness, or signs of infection (pain, swelling, redness, odor or green/yellow discharge around incision site)   Complete by: As directed    Call MD for:  severe uncontrolled pain   Complete by: As directed    Call MD for:  temperature >100.4   Complete by: As directed    Diet - low sodium heart healthy   Complete by: As directed    Increase activity slowly   Complete by: As directed      Allergies as of 01/24/2020   No Known Allergies     Medication List    TAKE these medications   aspirin 81 MG EC tablet Take 1 tablet (81 mg total) by mouth daily. Start taking on: January 25, 2020   atorvastatin 80 MG tablet Commonly known as: LIPITOR Take 1 tablet (80 mg total) by mouth daily at 6 PM.   carvedilol 25 MG tablet Commonly known as: COREG Take 1 tablet (25 mg total) by mouth 2 (two) times daily with a meal.   clopidogrel 75 MG tablet Commonly known as: PLAVIX Take 1 tablet (75 mg total) by mouth daily. Start taking on: January 25, 2020   sacubitril-valsartan 97-103 MG Commonly known as: ENTRESTO Take 1 tablet by mouth 2 (two) times daily.   spironolactone 25 MG tablet Commonly known as: ALDACTONE Take 1 tablet (25 mg total) by mouth daily. Start taking on: January 25, 2020      Follow-up Information    Jodelle Gross, NP Follow up on 02/01/2020.   Specialties: Nurse Practitioner, Radiology, Cardiology Why: Hospital follow-up March 7th at 10:45 AM Contact information: [address] [place] [address] [phone]        Sande Rives, MD Follow up on 02/27/2020.   Specialties: Internal Medicine, Cardiology, Radiology Why: Follow-up May 3rd at [facility] information: [address] [address] [place] [address] [phone]          No  Known Allergies  Consultations:  Cardiology, Dr. Flora Lipps    Procedures/Studies: DG Chest 2 View  Result Date: 01/20/2020 CLINICAL DATA:  Chest pain and hypertension EXAM: CHEST - 2 VIEW COMPARISON:  None. FINDINGS: Borderline enlargement of the left ventricle. Interstitial opacity with [employer]. No effusion or pneumothorax. IMPRESSION: Pulmonary edema. Electronically Signed   By: Marnee Spring M.D.   On: 01/20/2020 08:56   CT CHEST WO CONTRAST  Result Date: 01/20/2020 CLINICAL DATA:  Chest pain. EXAM: CT CHEST WITHOUT CONTRAST TECHNIQUE: Multidetector CT imaging of the chest was performed following the standard protocol without IV contrast. COMPARISON:  None. FINDINGS: Cardiovascular: No significant vascular findings. Normal heart size. No pericardial effusion. Mediastinum/Nodes: No enlarged mediastinal or axillary lymph nodes. Thyroid gland, trachea, and esophagus demonstrate no significant findings. Lungs/Pleura: A trace amount of atelectasis is seen within the posterior aspect of the bilateral lung bases. There is no evidence of acute infiltrate, pleural effusion or pneumothorax. Upper Abdomen: A 6 mm focus of parenchymal low attenuation is seen within the anterolateral aspect of the liver dome. Musculoskeletal:  No chest wall mass or suspicious bone lesions identified. IMPRESSION: 1. No acute findings in the chest. 2. 6 mm focus of parenchymal low attenuation within the anterolateral aspect of the liver dome. This may represent a small hemangioma. Correlation with nonemergent hepatic ultrasound is recommended. Electronically Signed   By: Aram Candela M.D.   On: 01/20/2020 18:21   CARDIAC CATHETERIZATION  Result Date: 01/23/2020  Prox LAD lesion is 30% stenosed.  Prox RCA lesion is 50% stenosed.  Dist RCA lesion is 45% stenosed.  RPAV lesion is 100% stenosed.  LV end diastolic pressure is mildly elevated.  Hemodynamic findings consistent with mild pulmonary hypertension.  1.  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 The LAD wraps around the apex and supplies the mid to distal inferior wall. Diffuse nonobstructive disease in other vessels 2. Elevated LV filling pressures. EDP 24 mm Hg. PCWP 19 mm Hg 3. Mild pulmonary HTN. 4. Normal cardiac output Plan: medical management   ECHOCARDIOGRAM COMPLETE  Result Date: 01/20/2020    ECHOCARDIOGRAM REPORT   Patient Name:   Jose Sharp Date of Exam: 01/20/2020 Medical Rec #:  [id number]         Height:       72.0 in Accession #:    [id number]        Weight:       199.0 lb Date of Birth:  September 29, 1986        BSA:          2.126 m Patient Age:    33 years          BP:           162/121 mmHg Patient Gender: M                 HR:           73 bpm. Exam Location:  Inpatient Procedure: 2D Echo, Cardiac Doppler and Color Doppler Indications:    Chest pain  History:        Patient has no prior history of Echocardiogram examinations.                 Arrythmias:Abnormal EKG; Signs/Symptoms:Chest Pain.  Sonographer:    Lavenia Atlas Referring Phys: [id number] TOCHUKWU AGBATA IMPRESSIONS  1. Left ventricular ejection fraction, by estimation, is 25 to 30%. The left ventricle has severely decreased function. The left ventricle demonstrates global hypokinesis. There is moderate concentric left ventricular hypertrophy. Left ventricular diastolic parameters are consistent with Grade III diastolic dysfunction (restrictive). Elevated left atrial pressure.  2. Right ventricular systolic function is low normal. The right ventricular size is normal. There is mildly elevated pulmonary artery systolic pressure. The estimated right ventricular systolic pressure is 37.8 mmHg.  3. Left atrial size was mildly dilated.  4. The mitral valve is grossly normal. Trivial mitral valve regurgitation. No evidence of mitral stenosis.  5. The aortic valve is tricuspid. Aortic valve regurgitation is not visualized. No aortic stenosis is present.  6. The inferior  vena cava is normal in size with <50% respiratory variability, suggesting right atrial pressure of 8 mmHg. FINDINGS  Left Ventricle: Left ventricular ejection fraction, by estimation, is 25 to 30%. The left ventricle has severely decreased function. The left ventricle demonstrates global hypokinesis. The left ventricular internal cavity size was normal in size. There is moderate concentric left ventricular hypertrophy. Left ventricular diastolic parameters are consistent with Grade III diastolic dysfunction (restrictive). Elevated left atrial pressure. Right Ventricle: The right  ventricular size is normal. No increase in right ventricular wall thickness. Right ventricular systolic function is low normal. There is mildly elevated pulmonary artery systolic pressure. The tricuspid regurgitant velocity is 2.73 m/s, and with an assumed right atrial pressure of 8 mmHg, the estimated right ventricular systolic pressure is 37.8 mmHg. Left Atrium: Left atrial size was mildly dilated. Right Atrium: Right atrial size was normal in size. Pericardium: There is no evidence of pericardial effusion. Mitral Valve: The mitral valve is grossly normal. Trivial mitral valve regurgitation. No evidence of mitral valve stenosis. Tricuspid Valve: The tricuspid valve is grossly normal. Tricuspid valve regurgitation is not demonstrated. No evidence of tricuspid stenosis. Aortic Valve: The aortic valve is tricuspid. Aortic valve regurgitation is not visualized. No aortic stenosis is present. Pulmonic Valve: The pulmonic valve was grossly normal. Pulmonic valve regurgitation is not visualized. No evidence of pulmonic stenosis. Aorta: The aortic root is normal in size and structure. Venous: The inferior vena cava is normal in size with less than 50% respiratory variability, suggesting right atrial pressure of 8 mmHg. IAS/Shunts: No atrial level shunt detected by color flow Doppler.  LEFT VENTRICLE PLAX 2D LVIDd:         5.40 cm  Diastology  LVIDs:         4.50 cm  LV e' lateral:   6.00 cm/s LV PW:         1.50 cm  LV E/e' lateral: 13.6 LV IVS:        1.50 cm  LV e' medial:    5.98 cm/s LVOT diam:     2.60 cm  LV E/e' medial:  13.6 LV SV:         73 LV SV Index:   34 LVOT Area:     5.31 cm  RIGHT VENTRICLE RV Basal diam:  2.60 cm RV S prime:     9.57 cm/s TAPSE (M-mode): 2.5 cm LEFT ATRIUM             Index       RIGHT ATRIUM           Index LA diam:        4.60 cm 2.16 cm/m  RA Area:     14.20 cm LA Vol (A2C):   67.0 ml 31.51 ml/m RA Volume:   35.00 ml  16.46 ml/m LA Vol (A4C):   68.6 ml 32.27 ml/m LA Biplane Vol: 68.7 ml 32.31 ml/m  AORTIC VALVE LVOT Vmax:   70.50 cm/s LVOT Vmean:  51.300 cm/s LVOT VTI:    0.137 m  AORTA Ao Root diam: 3.20 cm MITRAL VALVE               TRICUSPID VALVE MV Area (PHT): 4.49 cm    TR Peak grad:   29.8 mmHg MV Decel Time: 169 msec    TR Vmax:        273.00 cm/s MV E velocity: 81.40 cm/s MV A velocity: 38.10 cm/s  SHUNTS MV E/A ratio:  2.14        Systemic VTI:  0.14 m                            Systemic Diam: 2.60 cm Lennie Odor MD Electronically signed by Lennie Odor MD Signature Date/Time: 01/20/2020/3:13:38 PM    Final    VAS US RENAL ARTERY DUPLEX  Result Date: 01/23/2020 ABDOMINAL VISCERAL Indications: HTN Comparison Study: No prior exam. Performing Technologist: Wilkie Aye  Siebrecht ARDMS, RVT  Examination Guidelines: A complete evaluation includes B-mode imaging, spectral Doppler, color Doppler, and power Doppler as needed of all accessible portions of each vessel. Bilateral testing is considered an integral part of a complete examination. Limited examinations for reoccurring indications may be performed as noted.  Duplex Findings: +----------------------+--------+--------+------+--------+ Mesenteric            PSV cm/sEDV cm/sPlaqueComments +----------------------+--------+--------+------+--------+ Aorta Mid               115      20                   +----------------------+--------+--------+------+--------+ Celiac Artery Proximal  114      21                  +----------------------+--------+--------+------+--------+ SMA Proximal            257      22                  +----------------------+--------+--------+------+--------+    +------------------+--------+--------+-------+ Right Renal ArteryPSV cm/sEDV cm/sComment +------------------+--------+--------+-------+ Origin               33      14           +------------------+--------+--------+-------+ Proximal             33      12           +------------------+--------+--------+-------+ Mid                  29      11           +------------------+--------+--------+-------+ Distal               30      13           +------------------+--------+--------+-------+ +-----------------+--------+--------+-------+ Left Renal ArteryPSV cm/sEDV cm/sComment +-----------------+--------+--------+-------+ Origin              72      23           +-----------------+--------+--------+-------+ Proximal            51      18           +-----------------+--------+--------+-------+ Mid                 48      12           +-----------------+--------+--------+-------+ Distal              27      11           +-----------------+--------+--------+-------+ +------------+--------+--------+----+-----------+--------+--------+----+ Right KidneyPSV cm/sEDV cm/sRI  Left KidneyPSV cm/sEDV cm/sRI   +------------+--------+--------+----+-----------+--------+--------+----+ Upper Pole  17      8       0.53Upper Pole 21      11      0.50 +------------+--------+--------+----+-----------+--------+--------+----+ Mid         26      10      0.        19      7       0.64 +------------+--------+--------+----+-----------+--------+--------+----+ Lower Pole  17      9       0.50Lower Pole 37      14      0.61  +------------+--------+--------+----+-----------+--------+--------+----+ Hilar       23      8       0.65Hilar  21      9       0.57 +------------+--------+--------+----+-----------+--------+--------+----+ +------------------+-----+------------------+-----+ Right Kidney           Left Kidney             +------------------+-----+------------------+-----+ RAR                    RAR                     +------------------+-----+------------------+-----+ RAR (manual)      0.29 RAR (manual)      0.62  +------------------+-----+------------------+-----+ Cortex                 Cortex                  +------------------+-----+------------------+-----+ Cortex thickness       Corex thickness         +------------------+-----+------------------+-----+ Kidney length (cm)10.60Kidney length (cm)10.50 +------------------+-----+------------------+-----+  Summary: Renal:  Right: Normal size right kidney. Normal right Resisitive Index. No        evidence of right renal artery stenosis. RRV flow present. Left:  Normal size of left kidney. Normal left Resistive Index. No        evidence of left renal artery stenosis. LRV flow present. Mesenteric: Normal Celiac artery and Superior Mesenteric artery findings.  *See table(s) above for measurements and observations.  Diagnosing physician: Fabienne Bruns MD  Electronically signed by Fabienne Bruns MD on 01/23/2020 at 8:28:47 PM.    Final      Subjective: No acute issues or events overnight, feels quite well, otherwise denies chest pain, shortness of breath, nausea, vomiting, diarrhea, constipation, headache, fevers, chills.   Discharge Exam: Vitals:   01/24/20 1000 01/24/20 1007  BP: (!) 156/112 (!) 156/112  Pulse:    Resp:    Temp:    SpO2:     Vitals:   01/24/20 0758 01/24/20 0858 01/24/20 1000 01/24/20 1007  BP: (!) 167/110 (!) 166/105 (!) 156/112 (!) 156/112  Pulse:  73    Resp:      Temp:      TempSrc:      SpO2:       Weight:      Height:        General:  Pleasantly resting in bed, No acute distress. HEENT:  Normocephalic atraumatic.  Sclerae nonicteric, noninjected.  Extraocular movements intact bilaterally. Neck:  Without mass or deformity.  Trachea is midline. Lungs:  Clear to auscultate bilaterally without rhonchi, wheeze, or rales. Heart:  Regular rate and rhythm.  Without murmurs, rubs, or gallops. Abdomen:  Soft, nontender, nondistended.  Without guarding or rebound. Extremities: Without cyanosis, clubbing, edema, or obvious deformity. Vascular:  Dorsalis pedis and posterior tibial pulses palpable bilaterally. Skin:  Warm and dry, no erythema, no ulcerations.   The results of significant diagnostics from this hospitalization (including imaging, microbiology, ancillary and laboratory) are listed below for reference.     Microbiology: Recent Results (from the past 240 hour(s))  SARS CORONAVIRUS 2 (TAT 6-24 HRS) Nasopharyngeal Nasopharyngeal Swab     Status: None   Collection Time: 01/20/20 10:00 AM   Specimen: Nasopharyngeal Swab  Result Value Ref Range Status   SARS Coronavirus 2 NEGATIVE NEGATIVE Final    Comment: (NOTE) SARS-CoV-2 target nucleic acids are NOT DETECTED. The SARS-CoV-2 RNA is generally detectable in upper and lower respiratory specimens during the acute phase of infection. Negative results do not preclude SARS-CoV-2 infection, do not rule out  co-infections with other pathogens, and should not be used as the sole basis for treatment or other patient management decisions. Negative results must be combined with clinical observations, patient history, and epidemiological information. The expected result is Negative. Fact Sheet for Patients: HairSlick.no Fact Sheet for Healthcare Providers: quierodirigir.com This test is not yet approved or cleared by the [place] FDA and  has been authorized for detection and/or  diagnosis of SARS-CoV-2 by FDA under an Emergency Use Authorization (EUA). This EUA will remain  in effect (meaning this test can be used) for the duration of the COVID-19 declaration under Section 56 4(b)(1) of the Act, 21 U.S.C. section 360bbb-3(b)(1), unless the authorization is terminated or revoked sooner. Performed at [facility], [address], [place], [place] [address]   MRSA PCR Screening     Status: None   Collection Time: 01/20/20  7:39 PM   Specimen: Nasopharyngeal  Result Value Ref Range Status   MRSA by PCR NEGATIVE NEGATIVE Final    Comment:        The GeneXpert MRSA Assay (FDA approved for NASAL specimens only), is one component of a comprehensive MRSA colonization surveillance program. It is not intended to diagnose MRSA infection nor to guide or monitor treatment for MRSA infections. Performed at [facility], [address] [place], [place] [address]      Labs: BNP (last 3 results) Recent Labs    01/20/20 0754  BNP 216.7*   Basic Metabolic Panel: Recent Labs  Lab 01/20/20 0754 01/20/20 0754 01/21/20 0308 01/21/20 0308 01/22/20 0125 01/23/20 0427 01/23/20 1529 01/23/20 1534 01/24/20 0539  NA 136   < > 135   < > 136 138 140 140 137  K 3.8   < > 2.9*   < > 3.3* 3.5 3.4* 3.4* 3.8  CL 103  --  100  --  102 105  --   --  103  CO2 23  --  24  --  23 23  --   --  21*  GLUCOSE 128*  --  110*  --  102* 102*  --   --  105*  BUN 21*  --  17  --  17 13  --   --  13  CREATININE 1.44*  --  1.89*  --  1.85* 1.42*  --   --  1.54*  CALCIUM 9.0  --  9.0  --  8.9 9.2  --   --  9.4  MG  --   --  1.7  --  2.5* 2.2  --   --  2.0  PHOS  --   --  4.1  --  2.8 3.6  --   --  3.5   < > = values in this interval not displayed.   Liver Function Tests: Recent Labs  Lab 01/20/20 0754 01/21/20 0308  AST 24 55*  ALT 21 21  ALKPHOS 52 40  BILITOT 0.8 1.0  PROT 7.4 6.5  ALBUMIN 4.2 3.6   No results for input(s): LIPASE, AMYLASE in the last 168  hours. No results for input(s): AMMONIA in the last 168 hours. CBC: Recent Labs  Lab 01/20/20 0754 01/20/20 0754 01/21/20 0308 01/21/20 0308 01/22/20 0125 01/23/20 0427 01/23/20 1529 01/23/20 1534 01/24/20 0539  WBC 6.6  --  7.5  --  7.7 7.8  --   --  7.5  NEUTROABS 4.6  --   --   --   --   --   --   --   --  HGB 12.8*   < > 11.7*   < > 11.0* 11.2* 10.2* 10.5* 13.0  HCT 38.2*   < > 34.3*   < > 33.0* 33.8* 30.0* 31.0* 38.5*  MCV 87.6  --  85.5  --  85.9 87.1  --   --  87.1  PLT 211  --  212  --  201 220  --   --  234   < > = values in this interval not displayed.   Cardiac Enzymes: No results for input(s): CKTOTAL, CKMB, CKMBINDEX, TROPONINI in the last 168 hours. BNP: Invalid input(s): POCBNP CBG: No results for input(s): GLUCAP in the last 168 hours. D-Dimer No results for input(s): DDIMER in the last 72 hours. Hgb A1c No results for input(s): HGBA1C in the last 72 hours. Lipid Profile No results for input(s): CHOL, HDL, LDLCALC, TRIG, CHOLHDL, LDLDIRECT in the last 72 hours. Thyroid function studies No results for input(s): TSH, T4TOTAL, T3FREE, THYROIDAB in the last 72 hours.  Invalid input(s): FREET3 Anemia work up No results for input(s): VITAMINB12, FOLATE, FERRITIN, TIBC, IRON, RETICCTPCT in the last 72 hours. Urinalysis No results found for: COLORURINE, APPEARANCEUR, [place], [place], [place], [place], [place], [place], PROTEINUR, UROBILINOGEN, NITRITE, LEUKOCYTESUR Sepsis Labs Invalid input(s): PROCALCITONIN,  WBC,  LACTICIDVEN Microbiology Recent Results (from the past 240 hour(s))  SARS CORONAVIRUS 2 (TAT 6-24 HRS) Nasopharyngeal Nasopharyngeal Swab     Status: None   Collection Time: 01/20/20 10:00 AM   Specimen: Nasopharyngeal Swab  Result Value Ref Range Status   SARS Coronavirus 2 NEGATIVE NEGATIVE Final    Comment: (NOTE) SARS-CoV-2 target nucleic acids are NOT DETECTED. The SARS-CoV-2 RNA is generally detectable in upper and  lower respiratory specimens during the acute phase of infection. Negative results do not preclude SARS-CoV-2 infection, do not rule out co-infections with other pathogens, and should not be used as the sole basis for treatment or other patient management decisions. Negative results must be combined with clinical observations, patient history, and epidemiological information. The expected result is Negative. Fact Sheet for Patients: SugarRoll.be Fact Sheet for Healthcare Providers: https://www.woods-mathews.com/ This test is not yet approved or cleared by the [place] FDA and  has been authorized for detection and/or diagnosis of SARS-CoV-2 by FDA under an Emergency Use Authorization (EUA). This EUA will remain  in effect (meaning this test can be used) for the duration of the COVID-19 declaration under Section 56 4(b)(1) of the Act, 21 U.S.C. section 360bbb-3(b)(1), unless the authorization is terminated or revoked sooner. Performed at [facility], [address], [place], [place] [address]   MRSA PCR Screening     Status: None   Collection Time: 01/20/20  7:39 PM   Specimen: Nasopharyngeal  Result Value Ref Range Status   MRSA by PCR NEGATIVE NEGATIVE Final    Comment:        The GeneXpert MRSA Assay (FDA approved for NASAL specimens only), is one component of a comprehensive MRSA colonization surveillance program. It is not intended to diagnose MRSA infection nor to guide or monitor treatment for MRSA infections. Performed at [facility], [place] [address], [place], [place] [address]      Time coordinating discharge: Over 30 minutes  SIGNED:   Little Ishikawa, DO [facility] 01/24/2020, 11:49 AM

## 2020-01-24 NOTE — Progress Notes (Addendum)
Vitals this morning included BP 171/107, MAP 124, HR 68 after rechecks. MD paged for potential PRN for blood pressure. Instructed to wait and recheck, will page again for elevated BP.  BP recheck 179/106. Paged provider. Instructed to give AM coreg and entresto.Will monitor.

## 2020-01-28 LAB — ALDOSTERONE + RENIN ACTIVITY W/ RATIO
ALDO / PRA Ratio: 4.3 (ref 0.0–30.0)
Aldosterone: 1.8 ng/dL (ref 0.0–30.0)
PRA LC/MS/MS: 0.421 ng/mL/hr (ref 0.167–5.380)

## 2020-01-30 ENCOUNTER — Telehealth: Payer: Self-pay

## 2020-01-30 LAB — ALDOSTERONE + RENIN ACTIVITY W/ RATIO
ALDO / PRA Ratio: 13.6 (ref 0.0–30.0)
Aldosterone: 5.6 ng/dL (ref 0.0–30.0)
PRA LC/MS/MS: 0.413 ng/mL/hr (ref 0.167–5.380)

## 2020-01-30 NOTE — Telephone Encounter (Signed)
The patient has been notified of the lab result and verbalized understanding.  All questions (if any) were answered. Sigurd Sos, RN 01/30/2020 12:18 PM

## 2020-01-30 NOTE — Progress Notes (Signed)
Cardiology Office Note   Date:  02/01/2020   ID:  Jose Sharp, DOB [birth year], MRN [id number]  PCP:  System, Pcp Not In  Cardiologist:  Dr.McAlhany  CC: Post hospitalization follow up   History of Present Illness: Jose Sharp is a 34 y.o. male who presents for post hospital follow up after admission for hypertensive emergency. He has a history of HTN, with ICM, EF of 25%-30%.    Cardiac cath revealed 100% occluded right PLV branch, but lesion is not amendable to stenting. He was placed on ASA and Plavix for one year, started on atorvastatin 80 mg daily, carvedilol, Entresto 97/103 mg , and spironolactone.   He comes today without complaints. He has been doing some low level exercising. He is medically compliant. He has been trying to watch his salt. He denies weight gain, fluid retention, or palpitations.   Past Medical History:  Diagnosis Date  . Hypertension     Past Surgical History:  Procedure Laterality Date  . RIGHT/LEFT HEART CATH AND CORONARY ANGIOGRAPHY N/A 01/23/2020   Procedure: RIGHT/LEFT HEART CATH AND CORONARY ANGIOGRAPHY;  Surgeon: Martinique, Peter M, MD;  Location: [facility];  Service: Cardiovascular;  Laterality: N/A;     Current Outpatient Medications  Medication Sig Dispense Refill  . aspirin 81 MG EC tablet Take 1 tablet (81 mg total) by mouth daily. 30 tablet 1  . atorvastatin (LIPITOR) 80 MG tablet Take 1 tablet (80 mg total) by mouth daily at 6 PM. 30 tablet 1  . carvedilol (COREG) 25 MG tablet Take 1 tablet (25 mg total) by mouth 2 (two) times daily with a meal. 60 tablet 1  . clopidogrel (PLAVIX) 75 MG tablet Take 1 tablet (75 mg total) by mouth daily. 30 tablet 1  . sacubitril-valsartan (ENTRESTO) 97-103 MG Take 1 tablet by mouth 2 (two) times daily. 60 tablet 1  . spironolactone (ALDACTONE) 25 MG tablet Take 1 tablet (25 mg total) by mouth daily. 30 tablet 1   No current facility-administered medications for this visit.    Allergies:    Patient has no known allergies.    Social History:  The patient  reports that he has never smoked. He has never used smokeless tobacco. He reports current alcohol use. He reports current drug use. Drug: Marijuana.   Family History:  The patient's family history includes Hypertension in his mother.    ROS: All other systems are reviewed and negative. Unless otherwise mentioned in H&P    PHYSICAL EXAM: VS:  BP (!) 150/74   Pulse 62   Temp (!) 97.3 F (36.3 C)   Ht 6' (1.829 m)   Wt 200 lb 3.2 oz (90.8 kg)   SpO2 98%   BMI 27.15 kg/m  , BMI Body mass index is 27.15 kg/m. GEN: Well nourished, well developed, in no acute distress HEENT: normal Neck: no JVD, carotid bruits, or masses Cardiac: RRR; no murmurs, rubs, or gallops,no edema  Respiratory:  Clear to auscultation bilaterally, normal work of breathing GI: soft, nontender, nondistended, + BS MS: no deformity or atrophy Skin: warm and dry, no rash Neuro:  Strength and sensation are intact Psych: euthymic mood, full affect   EKG: NSR reate of  62 bpm. LVH. T-wave inversion inferior leads (Personally reviewed).   Recent Labs: 01/20/2020: B Natriuretic Peptide 216.7; TSH 0.571 01/21/2020: ALT 21 01/24/2020: BUN 13; Creatinine, Ser 1.54; Hemoglobin 13.0; Magnesium 2.0; Platelets 234; Potassium 3.8; Sodium 137    Lipid Panel    Component  Value Date/Time   CHOL 251 (H) 01/21/2020 0308   TRIG 99 01/21/2020 0308   HDL 50 01/21/2020 0308   CHOLHDL 5.0 01/21/2020 0308   VLDL 20 01/21/2020 0308   LDLCALC 181 (H) 01/21/2020 0308      Wt Readings from Last 3 Encounters:  02/01/20 200 lb 3.2 oz (90.8 kg)  01/24/20 196 lb 14.4 oz (89.3 kg)      Other studies Reviewed: TTE 01/20/2020 1. Left ventricular ejection fraction, by estimation, is 25 to 30%. The  left ventricle has severely decreased function. The left ventricle  demonstrates global hypokinesis. There is moderate concentric left  ventricular hypertrophy. Left  ventricular  diastolic parameters are consistent with Grade III diastolic dysfunction  (restrictive). Elevated left atrial pressure.  2. Right ventricular systolic function is low normal. The right  ventricular size is normal. There is mildly elevated pulmonary artery  systolic pressure. The estimated right ventricular systolic pressure is  37.8 mmHg.  3. Left atrial size was mildly dilated.  4. The mitral valve is grossly normal. Trivial mitral valve  regurgitation. No evidence of mitral stenosis.  5. The aortic valve is tricuspid. Aortic valve regurgitation is not  visualized. No aortic stenosis is present.  6. The inferior vena cava is normal in size with <50% respiratory  variability, suggesting right atrial pressure of 8 mmHg.   LHC 01/23/2020  Prox LAD lesion is 30% stenosed.  Prox RCA lesion is 50% stenosed.  Dist RCA lesion is 45% stenosed.  RPAV lesion is 100% stenosed.  LV end diastolic pressure is mildly elevated.  Hemodynamic findings consistent with mild pulmonary hypertension.  1. 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 The LAD wraps around the apex and supplies the mid to distal inferior wall. Diffuse nonobstructive disease in other vessels 2. Elevated LV filling pressures. EDP 24 mm Hg. PCWP 19 mm Hg 3. Mild pulmonary HTN.  4. Normal cardiac output  Echocardiogram 01/20/2020  1. Left ventricular ejection fraction, by estimation, is 25 to 30%. The  left ventricle has severely decreased function. The left ventricle  demonstrates global hypokinesis. There is moderate concentric left  ventricular hypertrophy. Left ventricular  diastolic parameters are consistent with Grade III diastolic dysfunction  (restrictive). Elevated left atrial pressure.  2. Right ventricular systolic function is low normal. The right  ventricular size is normal. There is mildly elevated pulmonary artery  systolic pressure. The estimated right ventricular  systolic pressure is  37.8 mmHg.  3. Left atrial size was mildly dilated.  4. The mitral valve is grossly normal. Trivial mitral valve  regurgitation. No evidence of mitral stenosis.  5. The aortic valve is tricuspid. Aortic valve regurgitation is not  visualized. No aortic stenosis is present.  6. The inferior vena cava is normal in size with <50% respiratory  variability, suggesting right atrial pressure of 8 mmHg.   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 Now on high dose Entresto, spironolactone, coreg.  BP is still not optimal. Will add hydralazine 25 mg BID. He is given a copy of the "Salty Six" and will continue to take and record daily BP. Consider evaluation for amyloid on follow up. Checking BMET today. Will repeat echo in 3 months   2. CAD: S/P cardiac cath with single vessel disease of the PLV branch. He will continue DAPT, statin and BB.   3. HL: Goal of LDL < 70. Continue atorvastatin as directed. Follow up labs in 3 months.  Last LDL 181 on 01/21/2020.    Current medicines are reviewed at length with the patient today.  I have spent 25 minutes dedicated to the care of this patient on the date of this encounter to include pre-visit review of records, assessment, management and diagnostic testing,with shared decision making.  Labs/ tests ordered today include: BMET  Bettey Mare. Liborio Nixon, ANP, AACC   02/01/2020 11:11 AM    [facility] [address] Office [phone] Fax [phone]  Notice: This dictation was prepared with Dragon dictation along with smaller phrase technology. Any transcriptional errors that result from this process are unintentional and may not be corrected upon review.

## 2020-01-30 NOTE — Telephone Encounter (Signed)
-----   Message from Laurann Montana, [place] sent at 01/30/2020 12:08 PM EDT ----- Hospital lab, therefore these don't go to [facility]. Please let pt know renin/aldosterone level was normal (this was hormone testing to assess for potential adrenal issue causing high BP). Continue plan as discussed.

## 2020-01-31 LAB — METANEPHRINES, PLASMA
Metanephrine, Free: 50.9 pg/mL (ref 0.0–88.0)
Normetanephrine, Free: 101.9 pg/mL (ref 0.0–110.1)

## 2020-02-01 ENCOUNTER — Encounter: Payer: Self-pay | Admitting: Adult Health

## 2020-02-01 ENCOUNTER — Other Ambulatory Visit: Payer: Self-pay

## 2020-02-01 ENCOUNTER — Ambulatory Visit (INDEPENDENT_AMBULATORY_CARE_PROVIDER_SITE_OTHER): Payer: Medicaid Other | Admitting: Adult Health

## 2020-02-01 VITALS — BP 150/74 | HR 62 | Temp 97.3°F | Ht 72.0 in | Wt 200.2 lb

## 2020-02-01 DIAGNOSIS — I428 Other cardiomyopathies: Secondary | ICD-10-CM

## 2020-02-01 DIAGNOSIS — Z79899 Other long term (current) drug therapy: Secondary | ICD-10-CM

## 2020-02-01 LAB — BASIC METABOLIC PANEL
BUN/Creatinine Ratio: 8 — ABNORMAL LOW (ref 9–20)
BUN: 12 mg/dL (ref 6–20)
CO2: 21 mmol/L (ref 20–29)
Calcium: 9.8 mg/dL (ref 8.7–10.2)
Chloride: 104 mmol/L (ref 96–106)
Creatinine, Ser: 1.53 mg/dL — ABNORMAL HIGH (ref 0.76–1.27)
GFR calc Af Amer: 67 mL/min/{1.73_m2} (ref >59)
GFR calc non Af Amer: 58 mL/min/{1.73_m2} — ABNORMAL LOW (ref >59)
Glucose: 94 mg/dL (ref 65–99)
Potassium: 5.5 mmol/L — ABNORMAL HIGH (ref 3.5–5.2)
Sodium: 138 mmol/L (ref 134–144)

## 2020-02-01 MED ORDER — HYDRALAZINE HCL 25 MG PO TABS: 25.0000 mg | ORAL_TABLET | Freq: Three times a day (TID) | ORAL | 3 refills | Status: AC

## 2020-02-01 NOTE — Patient Instructions (Signed)
Medication Instructions:  START- Hydralazine 25 mg by mouth twice a day  *If you need a refill on your cardiac medications before your next appointment, please call your pharmacy*   Lab Work: BMP today  If you have labs (blood work) drawn today and your tests are completely normal, you will receive your results only by: Marland Kitchen MyChart Message (if you have MyChart) OR . A paper copy in the mail If you have any lab test that is abnormal or we need to change your treatment, we will call you to review the results.   Testing/Procedures: None Ordered   Follow-Up: At [facility], you and your health needs are our priority.  As part of our continuing mission to provide you with exceptional heart care, we have created designated Provider Care Teams.  These Care Teams include your primary Cardiologist (physician) and Advanced Practice Providers (APPs -  Physician Assistants and Nurse Practitioners) who all work together to provide you with the care you need, when you need it.  We recommend signing up for the patient portal called "MyChart".  Sign up information is provided on this After Visit Summary.  MyChart is used to connect with patients for Virtual Visits (Telemedicine).  Patients are able to view lab/test results, encounter notes, upcoming appointments, etc.  Non-urgent messages can be sent to your provider as well.   To learn more about what you can do with MyChart, go to [email or web address].    Your next appointment:   Monday May 10th @ 11:45 am  The format for your next appointment:   In Person  Provider:   Joni Reining, DNP, ANP

## 2020-02-10 ENCOUNTER — Telehealth: Payer: Self-pay | Admitting: *Deleted

## 2020-02-10 ENCOUNTER — Encounter: Payer: Self-pay | Admitting: *Deleted

## 2020-02-10 DIAGNOSIS — Z79899 Other long term (current) drug therapy: Secondary | ICD-10-CM

## 2020-02-10 NOTE — Telephone Encounter (Signed)
Pt is aware of his blood work and medication changes, BMP ordered and mailed to pt

## 2020-02-10 NOTE — Telephone Encounter (Signed)
-----   Message from Jodelle Gross, NP sent at 02/02/2020  7:40 AM EDT ----- Potassium is high normal on the spironolactone.  Decrease to 12.5 mg daily from 25 mg daily. Repeat labs in one week.

## 2020-02-26 NOTE — Progress Notes (Deleted)
Cardiology Office Note:   Date:  02/26/2020  NAME:  Jose Sharp    MRN: [id number] DOB:  13-Apr-1986   PCP:  System, Pcp Not In  Cardiologist:  Jose Reining, NP  Electrophysiologist:  None   Referring MD: No ref. provider found   No chief complaint on file. ***  History of Present Illness:   Jose Sharp is a 34 y.o. male with a hx of CAD s/p NSTEMI, systolic HF, HTN who presents for follow-up. Admitted in March for new onset systolic HF and had NSTEMI. 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 CHF out of proportion to CAD.   Problem List 1. Systolic HF -EF 25% -2/2 HTN 2. CAD -NSTEMI 01/24/2020 -100% small RPLV -non-obstructive CAD -Cardiomyopathy out of proportion to CAD 3. Essential HTN -metanephrines negative, renin/aldo negative, renal duplex negative, uds negative  4. HLD -T chol 251, HDL 50, LDL 181, TG 99   Past Medical History: Past Medical History:  Diagnosis Date  . Hypertension     Past Surgical History: Past Surgical History:  Procedure Laterality Date  . RIGHT/LEFT HEART CATH AND CORONARY ANGIOGRAPHY N/A 01/23/2020   Procedure: RIGHT/LEFT HEART CATH AND CORONARY ANGIOGRAPHY;  Surgeon: [place], Peter M, MD;  Location: [facility];  Service: Cardiovascular;  Laterality: N/A;    Current Medications: No outpatient medications have been marked as taking for the 02/27/20 encounter (Appointment) with O'Neal, Ronnald Ramp, MD.     Allergies:    Patient has no known allergies.   Social History: Social History   Socioeconomic History  . Marital status: Single    Spouse name: Not on file  . Number of children: Not on file  . Years of education: Not on file  . Highest education level: Not on file  Occupational History  . Not on file  Tobacco Use  . Smoking status: Never Smoker  . Smokeless tobacco: Never Used  Substance and Sexual Activity  . Alcohol  use: Yes    Comment: 4 days per week, varying amounts  . Drug use: Yes    Types: Marijuana  . Sexual activity: Not on file  Other Topics Concern  . Not on file  Social History Narrative  . Not on file   Social Determinants of Health   Financial Resource Strain:   . Difficulty of Paying Living Expenses:   Food Insecurity:   . Worried About Programme researcher, broadcasting/film/video in the Last Year:   . Barista in the Last Year:   Transportation Needs:   . Freight forwarder (Medical):   Marland Kitchen Lack of Transportation (Non-Medical):   Physical Activity:   . Days of Exercise per Week:   . Minutes of Exercise per Session:   Stress:   . Feeling of Stress :   Social Connections:   . Frequency of Communication with Friends and Family:   . Frequency of Social Gatherings with Friends and Family:   . Attends Religious Services:   . Active Member of Clubs or Organizations:   . Attends Banker Meetings:   Marland Kitchen Marital Status:      Family History: The patient's ***family history includes Hypertension in his mother.  ROS:   All other ROS reviewed and negative. Pertinent positives noted in the HPI.     EKGs/Labs/Other Studies Reviewed:   The following studies were personally reviewed by me today:  EKG:  EKG is ***  ordered today.  The ekg ordered today demonstrates ***, and was personally reviewed by me.   LHC3/29/2021  Prox LAD lesion is 30% stenosed.  Prox RCA lesion is 50% stenosed.  Dist RCA lesion is 45% stenosed.  RPAV lesion is 100% stenosed.  LV end diastolic pressure is mildly elevated.  Hemodynamic findings consistent with mild pulmonary hypertension.  1. 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 The LAD wraps around the apex and supplies the mid to distal inferior wall. Diffuse nonobstructive disease in other vessels 2. Elevated LV filling pressures. EDP 24 mm Hg. PCWP 19 mm Hg 3. Mild pulmonary HTN.  4. Normal cardiac output  Echocardiogram  01/20/2020  1. Left ventricular ejection fraction, by estimation, is 25 to 30%. The  left ventricle has severely decreased function. The left ventricle  demonstrates global hypokinesis. There is moderate concentric left  ventricular hypertrophy. Left ventricular  diastolic parameters are consistent with Grade III diastolic dysfunction  (restrictive). Elevated left atrial pressure.  2. Right ventricular systolic function is low normal. The right  ventricular size is normal. There is mildly elevated pulmonary artery  systolic pressure. The estimated right ventricular systolic pressure is  37.8 mmHg.  3. Left atrial size was mildly dilated.  4. The mitral valve is grossly normal. Trivial mitral valve  regurgitation. No evidence of mitral stenosis.  5. The aortic valve is tricuspid. Aortic valve regurgitation is not  visualized. No aortic stenosis is present.  6. The inferior vena cava is normal in size with <50% respiratory  variability, suggesting right atrial pressure of 8 mmHg.   Recent Labs: 01/20/2020: B Natriuretic Peptide 216.7; TSH 0.571 01/21/2020: ALT 21 01/24/2020: Hemoglobin 13.0; Magnesium 2.0; Platelets 234 02/01/2020: BUN 12; Creatinine, Ser 1.53; Potassium 5.5; Sodium 138   Recent Lipid Panel    Component Value Date/Time   CHOL 251 (H) 01/21/2020 0308   TRIG 99 01/21/2020 0308   HDL 50 01/21/2020 0308   CHOLHDL 5.0 01/21/2020 0308   VLDL 20 01/21/2020 0308   LDLCALC 181 (H) 01/21/2020 0308    Physical Exam:   VS:  There were no vitals taken for this visit.   Wt Readings from Last 3 Encounters:  02/01/20 200 lb 3.2 oz (90.8 kg)  01/24/20 196 lb 14.4 oz (89.3 kg)    General: Well nourished, well developed, in no acute distress Heart: Atraumatic, normal size  Eyes: PEERLA, EOMI  Neck: Supple, no JVD Endocrine: No thryomegaly Cardiac: Normal S1, S2; RRR; no murmurs, rubs, or gallops Lungs: Clear to auscultation bilaterally, no wheezing, rhonchi or rales  Abd:  Soft, nontender, no hepatomegaly  Ext: No edema, pulses 2+ Musculoskeletal: No deformities, BUE and BLE strength normal and equal Skin: Warm and dry, no rashes   Neuro: Alert and oriented to person, place, time, and situation, CNII-XII grossly intact, no focal deficits  Psych: Normal mood and affect   ASSESSMENT:   Lyrick Lagrand is a 34 y.o. male who presents for the following: No diagnosis found.  PLAN:   There are no diagnoses linked to this encounter.  Disposition: No follow-ups on file.  Medication Adjustments/Labs and Tests Ordered: Current medicines are reviewed at length with the patient today.  Concerns regarding medicines are outlined above.  No orders of the defined types were placed in this encounter.  No orders of the defined types were placed in this encounter.   There are no Patient Instructions on file for this visit.   Time Spent with Patient:  I have spent a total of *** minutes with patient reviewing hospital notes, telemetry, EKGs, labs and examining the patient as well as establishing an assessment and plan that was discussed with the patient.  > 50% of time was spent in direct patient care.  Signed, Jose Sharp, [place]  [address] [place], [place] [address] [phone]  02/26/2020 2:18 PM

## 2020-02-27 ENCOUNTER — Ambulatory Visit: Payer: Medicaid Other | Admitting: Cardiovascular Disease

## 2020-02-27 DIAGNOSIS — I10 Essential (primary) hypertension: Secondary | ICD-10-CM

## 2020-02-27 DIAGNOSIS — I2510 Atherosclerotic heart disease of native coronary artery without angina pectoris: Secondary | ICD-10-CM

## 2020-02-27 DIAGNOSIS — I5022 Chronic systolic (congestive) heart failure: Secondary | ICD-10-CM

## 2020-02-27 DIAGNOSIS — E782 Mixed hyperlipidemia: Secondary | ICD-10-CM

## 2020-02-29 NOTE — Progress Notes (Deleted)
Cardiology Office Note:   Date:  02/29/2020  NAME:  Jose Sharp    MRN: [id number] DOB:  06-Sep-1986   PCP:  System, Pcp Not In  Cardiologist:  Joni Reining, NP  Electrophysiologist:  None   Referring MD: No ref. provider found   No chief complaint on file. ***  History of Present Illness:   Jose Sharp is a 34 y.o. male with a hx of CAD, systolic HF, HLD who presents for follow-up of CHF. Admitted in March for HTN crisis and found to have systolic HF and NSTEMI. CHF out of proportion to CAD.   Problem List 1. 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  2. NSTEMI -100% occluded rPLV -diffuse non-obstructive CAD 3. HLD -T chol 251, LDL 181, HDL 50, TG 99  Past Medical History: Past Medical History:  Diagnosis Date  . Hypertension     Past Surgical History: Past Surgical History:  Procedure Laterality Date  . RIGHT/LEFT HEART CATH AND CORONARY ANGIOGRAPHY N/A 01/23/2020   Procedure: RIGHT/LEFT HEART CATH AND CORONARY ANGIOGRAPHY;  Surgeon: Swaziland, Peter M, MD;  Location: [facility] INVASIVE CV LAB;  Service: Cardiovascular;  Laterality: N/A;    Current Medications: No outpatient medications have been marked as taking for the 03/01/20 encounter (Appointment) with O'Neal, Ronnald Ramp, MD.     Allergies:    Patient has no known allergies.   Social History: Social History   Socioeconomic History  . Marital status: Single    Spouse name: Not on file  . Number of children: Not on file  . Years of education: Not on file  . Highest education level: Not on file  Occupational History  . Not on file  Tobacco Use  . Smoking status: Never Smoker  . Smokeless tobacco: Never Used  Substance and Sexual Activity  . Alcohol use: Yes    Comment: 4 days per week, varying amounts  . Drug use: Yes    Types: Marijuana  . Sexual activity: Not on file  Other Topics Concern  .  Not on file  Social History Narrative  . Not on file   Social Determinants of Health   Financial Resource Strain:   . Difficulty of Paying Living Expenses:   Food Insecurity:   . Worried About Programme researcher, broadcasting/film/video in the Last Year:   . Barista in the Last Year:   Transportation Needs:   . Freight forwarder (Medical):   Marland Kitchen Lack of Transportation (Non-Medical):   Physical Activity:   . Days of Exercise per Week:   . Minutes of Exercise per Session:   Stress:   . Feeling of Stress :   Social Connections:   . Frequency of Communication with Friends and Family:   . Frequency of Social Gatherings with Friends and Family:   . Attends Religious Services:   . Active Member of Clubs or Organizations:   . Attends Banker Meetings:   Marland Kitchen Marital Status:      Family History: The patient's ***family history includes Hypertension in his mother.  ROS:   All other ROS reviewed and negative. Pertinent positives noted in the HPI.     EKGs/Labs/Other Studies Reviewed:   The following studies were personally reviewed by me today:  EKG:  EKG is *** ordered today.  The ekg ordered today demonstrates ***, and was personally reviewed by me.  LHC/RHC 01/23/2020  Prox LAD lesion is 30% stenosed.  Prox RCA lesion is 50% stenosed.  Dist RCA lesion is 45% stenosed.  RPAV lesion is 100% stenosed.  LV end diastolic pressure is mildly elevated.  Hemodynamic findings consistent with mild pulmonary hypertension.   1. 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 The LAD wraps around the apex and supplies the mid to distal inferior wall. Diffuse nonobstructive disease in other vessels 2. Elevated LV filling pressures. EDP 24 mm Hg. PCWP 19 mm Hg 3. Mild pulmonary HTN.  4. Normal cardiac output   TTE 01/20/2020 1. Left ventricular ejection fraction, by estimation, is 25 to 30%. The  left ventricle has severely decreased function. The left ventricle   demonstrates global hypokinesis. There is moderate concentric left  ventricular hypertrophy. Left ventricular  diastolic parameters are consistent with Grade III diastolic dysfunction  (restrictive). Elevated left atrial pressure.  2. Right ventricular systolic function is low normal. The right  ventricular size is normal. There is mildly elevated pulmonary artery  systolic pressure. The estimated right ventricular systolic pressure is  36.6 mmHg.  3. Left atrial size was mildly dilated.  4. The mitral valve is grossly normal. Trivial mitral valve  regurgitation. No evidence of mitral stenosis.  5. The aortic valve is tricuspid. Aortic valve regurgitation is not  visualized. No aortic stenosis is present.  6. The inferior vena cava is normal in size with <50% respiratory  variability, suggesting right atrial pressure of 8 mmHg.   Recent Labs: 01/20/2020: B Natriuretic Peptide 216.7; TSH 0.571 01/21/2020: ALT 21 01/24/2020: Hemoglobin 13.0; Magnesium 2.0; Platelets 234 02/01/2020: BUN 12; Creatinine, Ser 1.53; Potassium 5.5; Sodium 138   Recent Lipid Panel    Component Value Date/Time   CHOL 251 (H) 01/21/2020 0308   TRIG 99 01/21/2020 0308   HDL 50 01/21/2020 0308   CHOLHDL 5.0 01/21/2020 0308   VLDL 20 01/21/2020 0308   LDLCALC 181 (H) 01/21/2020 0308    Physical Exam:   VS:  There were no vitals taken for this visit.   Wt Readings from Last 3 Encounters:  02/01/20 200 lb 3.2 oz (90.8 kg)  01/24/20 196 lb 14.4 oz (89.3 kg)    General: Well nourished, well developed, in no acute distress Heart: Atraumatic, normal size  Eyes: PEERLA, EOMI  Neck: Supple, no JVD Endocrine: No thryomegaly Cardiac: Normal S1, S2; RRR; no murmurs, rubs, or gallops Lungs: Clear to auscultation bilaterally, no wheezing, rhonchi or rales  Abd: Soft, nontender, no hepatomegaly  Ext: No edema, pulses 2+ Musculoskeletal: No deformities, BUE and BLE strength normal and equal Skin: Warm and dry,  no rashes   Neuro: Alert and oriented to person, place, time, and situation, CNII-XII grossly intact, no focal deficits  Psych: Normal mood and affect   ASSESSMENT:   Jose Sharp is a 34 y.o. male who presents for the following: No diagnosis found.  PLAN:   There are no diagnoses linked to this encounter.  Disposition: No follow-ups on file.  Medication Adjustments/Labs and Tests Ordered: Current medicines are reviewed at length with the patient today.  Concerns regarding medicines are outlined above.  No orders of the defined types were placed in this encounter.  No orders of the defined types were placed in this encounter.   There are no Patient Instructions on file for this visit.   Time Spent with Patient: I have spent a total of *** minutes with patient reviewing hospital notes, telemetry, EKGs, labs and examining the patient as well as establishing an  assessment and plan that was discussed with the patient.  > 50% of time was spent in direct patient care.  Signed, Lenna Gilford. Flora Lipps, MD [facility]  [address], [address] [place], [place] [address] [phone]  02/29/2020 10:28 PM

## 2020-03-01 ENCOUNTER — Ambulatory Visit: Payer: Medicaid Other | Admitting: Cardiovascular Disease

## 2020-03-01 NOTE — Progress Notes (Deleted)
Cardiology Office Note   Date:  03/01/2020   ID:  Jose Sharp, DOB [birth year], MRN [id number]  PCP:  System, Pcp Not In  Cardiologist:  Tommi Rumps.[place] No chief complaint on file.    History of Present Illness: Jose Sharp is a 34 y.o. male who presents for ongoing assessment and management of hypertension with recent admission in March 2021 for hypertensive urgency.  Other history includes ischemic cardiomyopathy with an EF of 25%.  Cardiac cath revealed 100% occluded right PLV branch, but lesion is not amendable to stenting. He was placed on ASA and Plavix for one year, started on atorvastatin 80 mg daily, carvedilol, Entresto 97/103 mg , and spironolactone.   I saw him last on 02/21/2020.  Blood pressure was not optimal despite maximum dose of Entresto spironolactone Coreg.  I added hydralazine 25 mg twice daily and he was given a copy of the "salty 6" and was to take his blood pressure daily and record it for follow-up visit.  I did a follow-up BMET.  He was found to have hyperkalemia (5.5) on follow-up and therefore spironolactone was decreased to 12.5 mg daily from 25 mg daily.  He was to have repeat labs on this office visit   Past Medical History:  Diagnosis Date  . Hypertension     Past Surgical History:  Procedure Laterality Date  . RIGHT/LEFT HEART CATH AND CORONARY ANGIOGRAPHY N/A 01/23/2020   Procedure: RIGHT/LEFT HEART CATH AND CORONARY ANGIOGRAPHY;  Surgeon: Swaziland, Peter M, MD;  Location: [facility] INVASIVE CV LAB;  Service: Cardiovascular;  Laterality: N/A;     Current Outpatient Medications  Medication Sig Dispense Refill  . aspirin 81 MG EC tablet Take 1 tablet (81 mg total) by mouth daily. 30 tablet 1  . atorvastatin (LIPITOR) 80 MG tablet Take 1 tablet (80 mg total) by mouth daily at 6 PM. 30 tablet 1  . carvedilol (COREG) 25 MG tablet Take 1 tablet (25 mg total) by mouth 2 (two) times daily with a meal. 60 tablet 1  . clopidogrel (PLAVIX) 75 MG tablet Take 1  tablet (75 mg total) by mouth daily. 30 tablet 1  . hydrALAZINE (APRESOLINE) 25 MG tablet Take 1 tablet (25 mg total) by mouth 3 (three) times daily. 270 tablet 3  . sacubitril-valsartan (ENTRESTO) 97-103 MG Take 1 tablet by mouth 2 (two) times daily. 60 tablet 1  . spironolactone (ALDACTONE) 25 MG tablet Take 1 tablet (25 mg total) by mouth daily. 30 tablet 1   No current facility-administered medications for this visit.    Allergies:   Patient has no known allergies.    Social History:  The patient  reports that he has never smoked. He has never used smokeless tobacco. He reports current alcohol use. He reports current drug use. Drug: Marijuana.   Family History:  The patient's family history includes Hypertension in his mother.    ROS: All other systems are reviewed and negative. Unless otherwise mentioned in H&P    PHYSICAL EXAM: VS:  There were no vitals taken for this visit. , BMI There is no height or weight on file to calculate BMI. GEN: Well nourished, well developed, in no acute distress HEENT: normal Neck: no JVD, carotid bruits, or masses Cardiac: ***RRR; no murmurs, rubs, or gallops,no edema  Respiratory:  Clear to auscultation bilaterally, normal work of breathing GI: soft, nontender, nondistended, + BS MS: no deformity or atrophy Skin: warm and dry, no rash Neuro:  Strength and sensation are intact Psych: euthymic mood,  full affect   EKG:  EKG {ACTION; IS/IS HWE:[id number]} ordered today. The ekg ordered today demonstrates ***   Recent Labs: 01/20/2020: B Natriuretic Peptide 216.7; TSH 0.571 01/21/2020: ALT 21 01/24/2020: Hemoglobin 13.0; Magnesium 2.0; Platelets 234 02/01/2020: BUN 12; Creatinine, Ser 1.53; Potassium 5.5; Sodium 138    Lipid Panel    Component Value Date/Time   CHOL 251 (H) 01/21/2020 0308   TRIG 99 01/21/2020 0308   HDL 50 01/21/2020 0308   CHOLHDL 5.0 01/21/2020 0308   VLDL 20 01/21/2020 0308   LDLCALC 181 (H) 01/21/2020 0308      Wt  Readings from Last 3 Encounters:  02/01/20 200 lb 3.2 oz (90.8 kg)  01/24/20 196 lb 14.4 oz (89.3 kg)      Other studies Reviewed: TTE 01/20/2020 1. Left ventricular ejection fraction, by estimation, is 25 to 30%. The  left ventricle has severely decreased function. The left ventricle  demonstrates global hypokinesis. There is moderate concentric left  ventricular hypertrophy. Left ventricular  diastolic parameters are consistent with Grade III diastolic dysfunction  (restrictive). Elevated left atrial pressure.  2. Right ventricular systolic function is low normal. The right  ventricular size is normal. There is mildly elevated pulmonary artery  systolic pressure. The estimated right ventricular systolic pressure is  37.8 mmHg.  3. Left atrial size was mildly dilated.  4. The mitral valve is grossly normal. Trivial mitral valve  regurgitation. No evidence of mitral stenosis.  5. The aortic valve is tricuspid. Aortic valve regurgitation is not  visualized. No aortic stenosis is present.  6. The inferior vena cava is normal in size with <50% respiratory  variability, suggesting right atrial pressure of 8 mmHg.   LHC3/29/2021  Prox LAD lesion is 30% stenosed.  Prox RCA lesion is 50% stenosed.  Dist RCA lesion is 45% stenosed.  RPAV lesion is 100% stenosed.  LV end diastolic pressure is mildly elevated.  Hemodynamic findings consistent with mild pulmonary hypertension.  1. 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 The LAD wraps around the apex and supplies the mid to distal inferior wall. Diffuse nonobstructive disease in other vessels 2. Elevated LV filling pressures. EDP 24 mm Hg. PCWP 19 mm Hg 3. Mild pulmonary HTN.  4. Normal cardiac output  Echocardiogram 01/20/2020  1. Left ventricular ejection fraction, by estimation, is 25 to 30%. The  left ventricle has severely decreased function. The left ventricle  demonstrates global hypokinesis.  There is moderate concentric left  ventricular hypertrophy. Left ventricular  diastolic parameters are consistent with Grade III diastolic dysfunction  (restrictive). Elevated left atrial pressure.  2. Right ventricular systolic function is low normal. The right  ventricular size is normal. There is mildly elevated pulmonary artery  systolic pressure. The estimated right ventricular systolic pressure is  37.8 mmHg.  3. Left atrial size was mildly dilated.  4. The mitral valve is grossly normal. Trivial mitral valve  regurgitation. No evidence of mitral stenosis.  5. The aortic valve is tricuspid. Aortic valve regurgitation is not  visualized. No aortic stenosis is present.  6. The inferior vena cava is normal in size with <50% respiratory  variability, suggesting right atrial pressure of 8 mmHg.     ASSESSMENT AND PLAN:  1.  ***   Current medicines are reviewed at length with the patient today.  I have spent *** dedicated to the care of this patient on the date of this encounter to include pre-visit review of records, assessment, management and  diagnostic testing,with shared decision making.  Labs/ tests ordered today include: *** Phill Myron. West Pugh, ANP, AACC   03/01/2020 1:57 PM    [place] [address] Office [phone] Fax [phone]  Notice: This dictation was prepared with Dragon dictation along with smaller phrase technology. Any transcriptional errors that result from this process are unintentional and may not be corrected upon review.

## 2020-03-05 ENCOUNTER — Ambulatory Visit: Payer: Medicaid Other | Admitting: Adult Health

## 2021-07-25 IMAGING — CT CT CHEST W/O CM
2 of 3 series · 15 of 36 positions shown, 18 images · non-contrast
Comparison: None.

CLINICAL DATA: Chest pain.

EXAM:
CT CHEST WITHOUT CONTRAST
TECHNIQUE: Multidetector CT imaging of the chest was performed following the
standard protocol without IV contrast.

[Series 2: thorax · axial · 0.72mm/px · z∈[-27,+253]mm · 12 of 166 slices shown, 15 images]
[im 13/166  mediastinal]
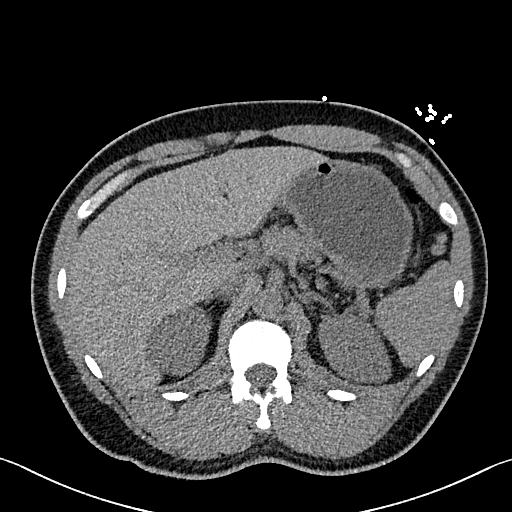
[im 13/166  lung]
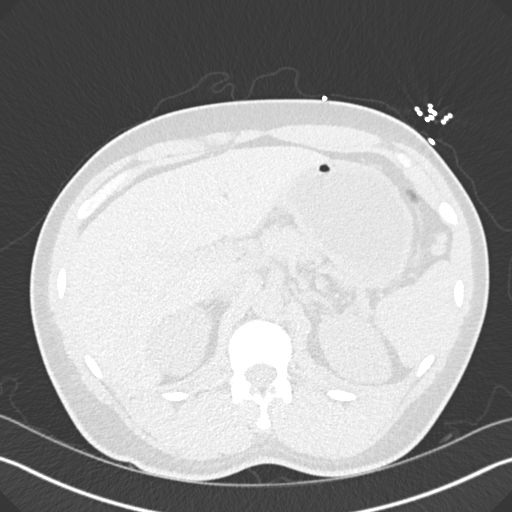
[im 25/166  lung]
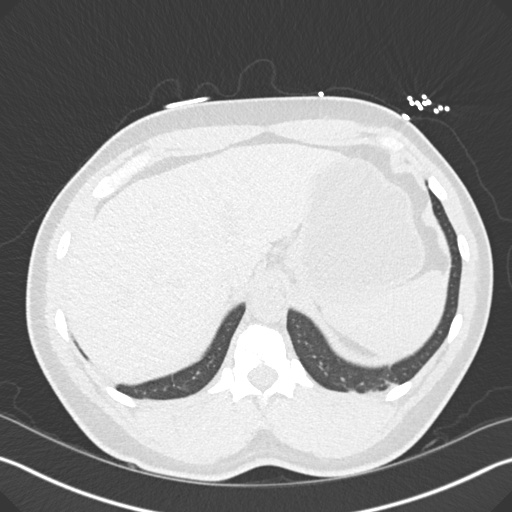
[im 37/166  lung]
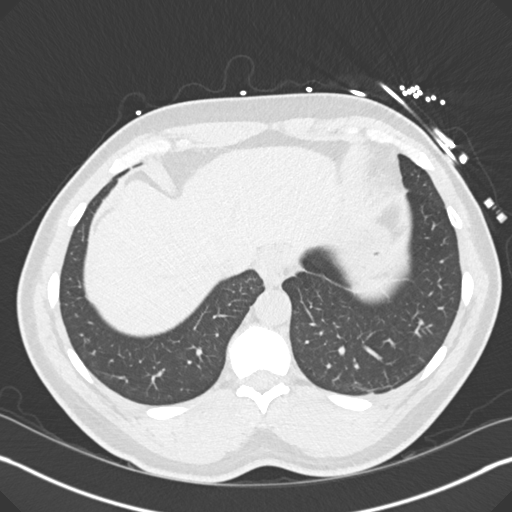
[im 49/166  lung]
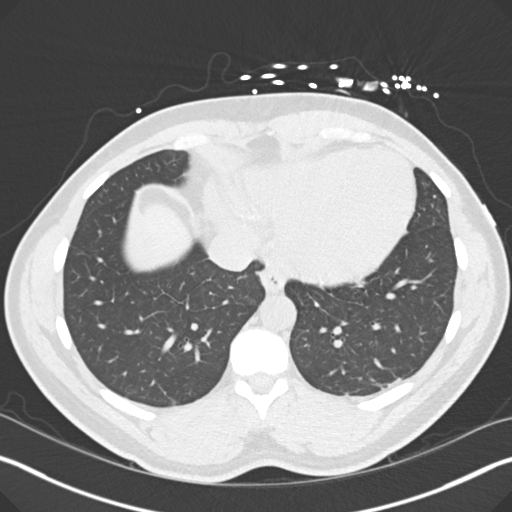
[im 62/166  mediastinal]
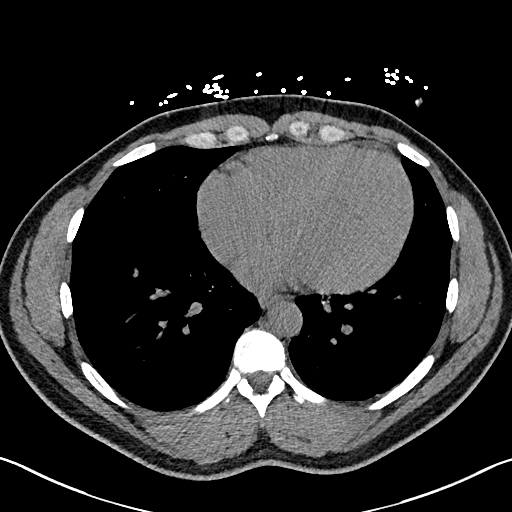
[im 62/166  lung]
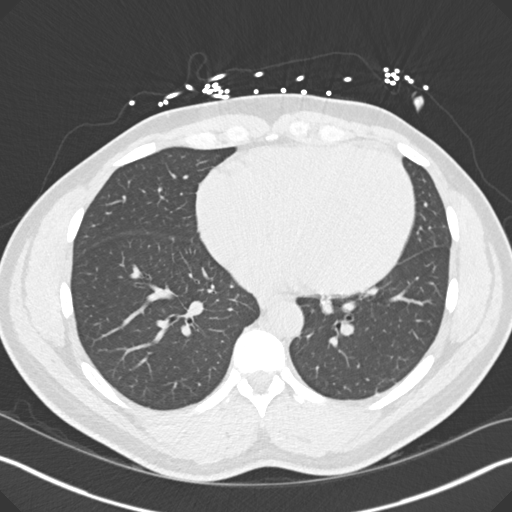
[im 74/166  lung]
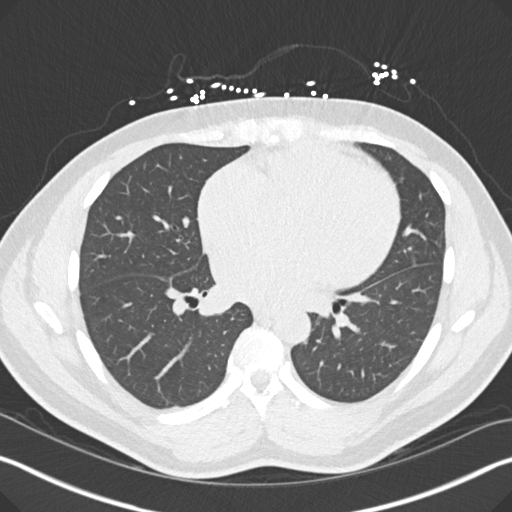
[im 92/166  lung]
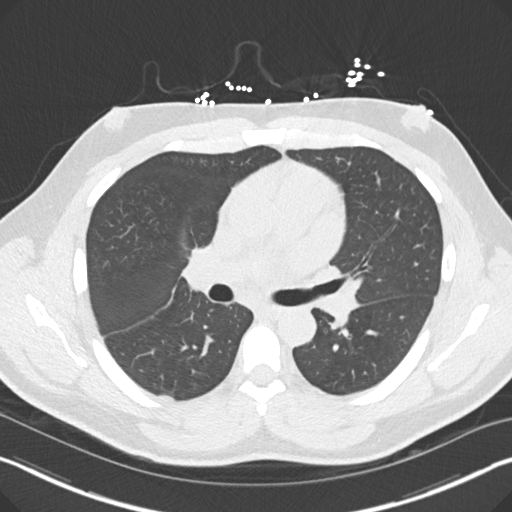
[im 104/166  lung]
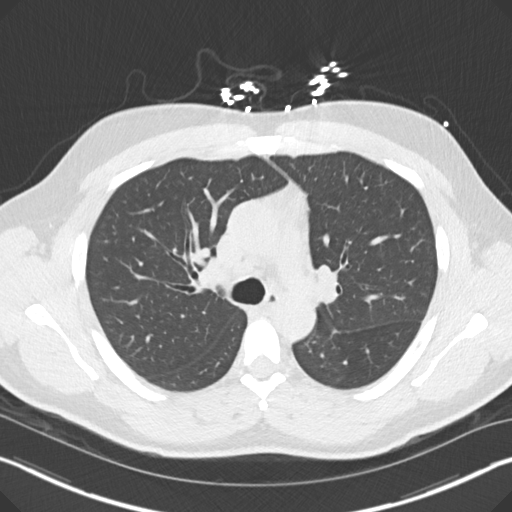
[im 117/166  mediastinal]
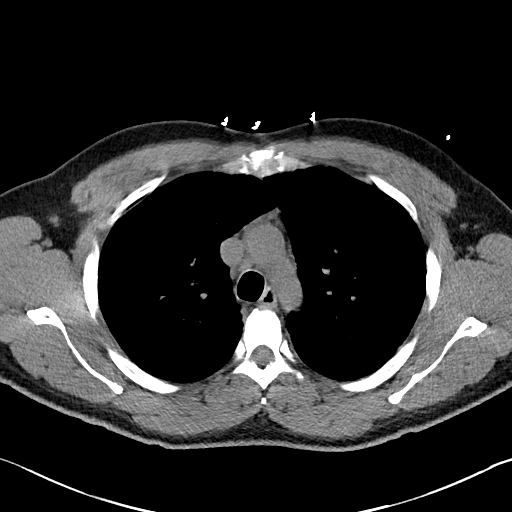
[im 117/166  lung]
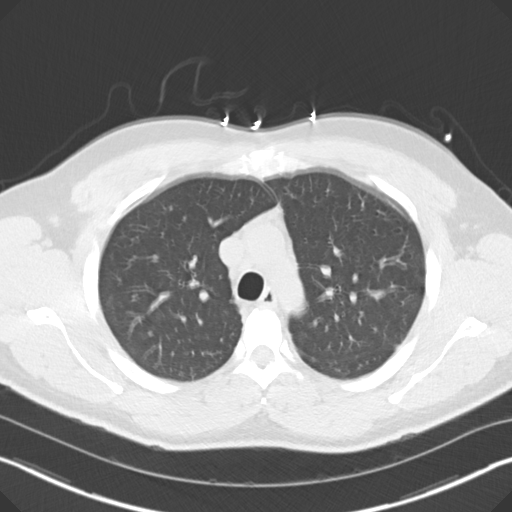
[im 129/166  lung]
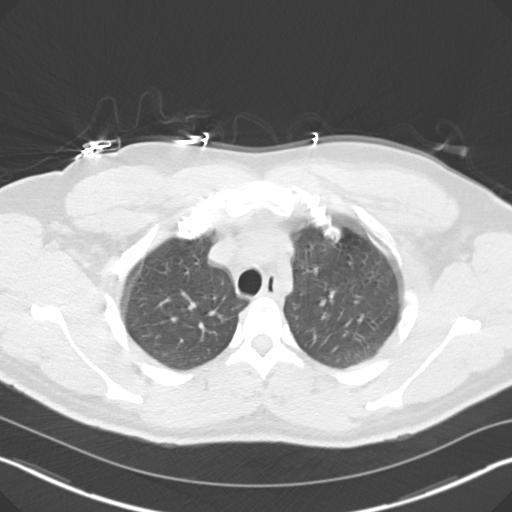
[im 141/166  lung]
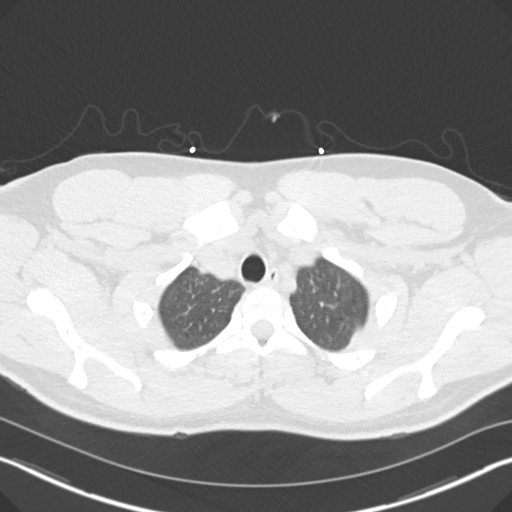
[im 153/166  lung]
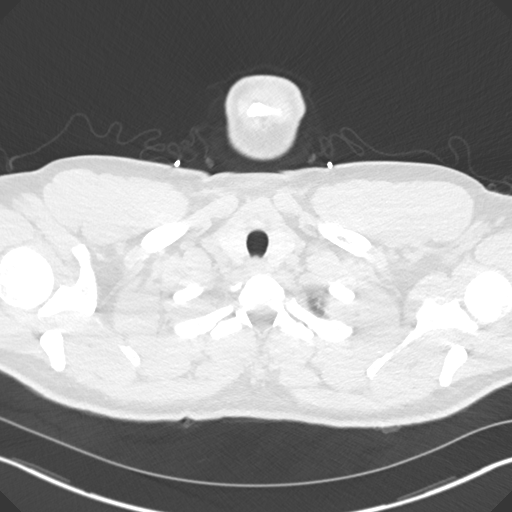

[Series 6: coronal · coronal · 0.67mm/px · 3 of 136 slices shown]
[im 28/136  lung]
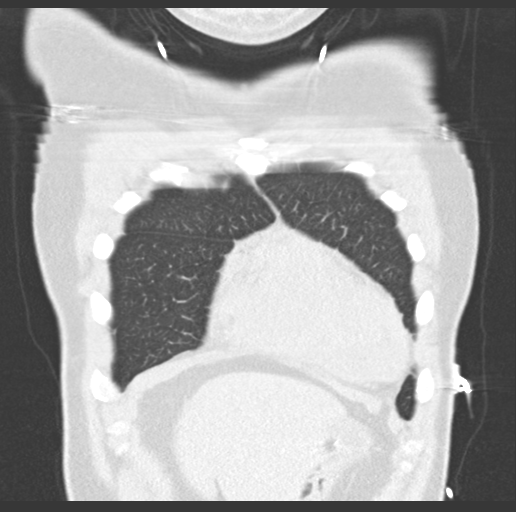
[im 55/136  lung]
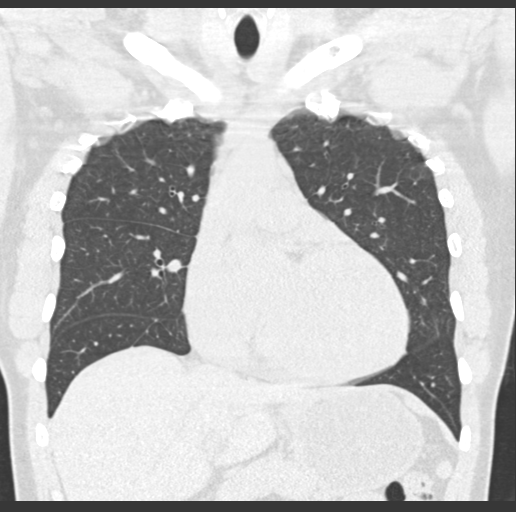
[im 82/136  lung]
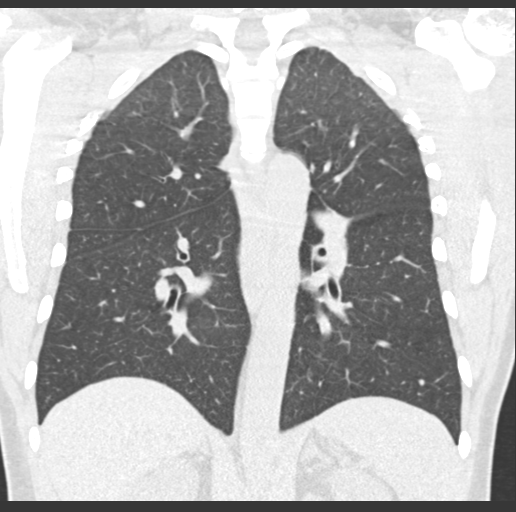

[15 of 36 positions shown; findings below may reference images not displayed]

FINDINGS: Cardiovascular: No significant vascular findings. Normal heart size.
No pericardial effusion.

Mediastinum/Nodes: No enlarged mediastinal or axillary lymph nodes.
Thyroid gland, trachea, and esophagus demonstrate no significant
findings.

Lungs/Pleura: A trace amount of atelectasis is seen within the
posterior aspect of the bilateral lung bases.

There is no evidence of acute infiltrate, pleural effusion or
pneumothorax.

Upper Abdomen: A 6 mm focus of parenchymal low attenuation is seen
within the anterolateral aspect of the liver dome.

Musculoskeletal: No chest wall mass or suspicious bone lesions
identified.
IMPRESSION: 1. No acute findings in the chest.
2. 6 mm focus of parenchymal low attenuation within the
anterolateral aspect of the liver dome. This may represent a small
hemangioma. Correlation with nonemergent hepatic ultrasound is
recommended.
